# Patient Record
Sex: Female | Born: 1990 | Hispanic: Yes | Marital: Single | Smoking: Light tobacco smoker
Health system: Southern US, Community
[De-identification: ages and names within clinical notes are randomized; demographics above are authoritative.]

## PROBLEM LIST (undated history)

## (undated) DIAGNOSIS — N946 Dysmenorrhea, unspecified: Secondary | ICD-10-CM

## (undated) DIAGNOSIS — F329 Major depressive disorder, single episode, unspecified: Secondary | ICD-10-CM

## (undated) DIAGNOSIS — F319 Bipolar disorder, unspecified: Secondary | ICD-10-CM

## (undated) DIAGNOSIS — F419 Anxiety disorder, unspecified: Secondary | ICD-10-CM

## (undated) DIAGNOSIS — F32A Depression, unspecified: Secondary | ICD-10-CM

## (undated) HISTORY — DX: Major depressive disorder, single episode, unspecified: F32.9

## (undated) HISTORY — DX: Depression, unspecified: F32.A

## (undated) HISTORY — DX: Anxiety disorder, unspecified: F41.9

## (undated) HISTORY — DX: Dysmenorrhea, unspecified: N94.6

---
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
## 2014-12-26 ENCOUNTER — Emergency Department (HOSPITAL_COMMUNITY): Payer: BLUE CROSS/BLUE SHIELD

## 2014-12-26 ENCOUNTER — Inpatient Hospital Stay (HOSPITAL_COMMUNITY)
Admission: EM | Admit: 2014-12-26 | Discharge: 2014-12-30 | DRG: 086 | Disposition: A | Discharge status: Home / Self Care | Payer: BLUE CROSS/BLUE SHIELD | Attending: General Surgery | Admitting: General Surgery

## 2014-12-26 ENCOUNTER — Encounter (HOSPITAL_COMMUNITY): Payer: Self-pay | Admitting: Radiology

## 2014-12-26 DIAGNOSIS — S0101XA Laceration without foreign body of scalp, initial encounter: Secondary | ICD-10-CM | POA: Diagnosis present

## 2014-12-26 DIAGNOSIS — K5900 Constipation, unspecified: Secondary | ICD-10-CM | POA: Diagnosis not present

## 2014-12-26 DIAGNOSIS — H9191 Unspecified hearing loss, right ear: Secondary | ICD-10-CM | POA: Diagnosis not present

## 2014-12-26 DIAGNOSIS — S060XAA Concussion with loss of consciousness status unknown, initial encounter: Secondary | ICD-10-CM | POA: Diagnosis present

## 2014-12-26 DIAGNOSIS — S2241XA Multiple fractures of ribs, right side, initial encounter for closed fracture: Secondary | ICD-10-CM

## 2014-12-26 DIAGNOSIS — R51 Headache: Secondary | ICD-10-CM | POA: Diagnosis present

## 2014-12-26 DIAGNOSIS — S01511A Laceration without foreign body of lip, initial encounter: Secondary | ICD-10-CM | POA: Diagnosis present

## 2014-12-26 DIAGNOSIS — S82899A Other fracture of unspecified lower leg, initial encounter for closed fracture: Secondary | ICD-10-CM

## 2014-12-26 DIAGNOSIS — S060X9A Concussion with loss of consciousness of unspecified duration, initial encounter: Secondary | ICD-10-CM | POA: Diagnosis present

## 2014-12-26 DIAGNOSIS — S0300XA Dislocation of jaw, unspecified side, initial encounter: Secondary | ICD-10-CM | POA: Diagnosis present

## 2014-12-26 DIAGNOSIS — H9190 Unspecified hearing loss, unspecified ear: Secondary | ICD-10-CM

## 2014-12-26 DIAGNOSIS — S0219XA Other fracture of base of skull, initial encounter for closed fracture: Secondary | ICD-10-CM | POA: Diagnosis present

## 2014-12-26 DIAGNOSIS — S2249XA Multiple fractures of ribs, unspecified side, initial encounter for closed fracture: Secondary | ICD-10-CM | POA: Diagnosis present

## 2014-12-26 DIAGNOSIS — T1490XA Injury, unspecified, initial encounter: Secondary | ICD-10-CM

## 2014-12-26 DIAGNOSIS — T07XXXA Unspecified multiple injuries, initial encounter: Secondary | ICD-10-CM

## 2014-12-26 DIAGNOSIS — W19XXXA Unspecified fall, initial encounter: Secondary | ICD-10-CM | POA: Diagnosis present

## 2014-12-26 DIAGNOSIS — F319 Bipolar disorder, unspecified: Secondary | ICD-10-CM | POA: Diagnosis present

## 2014-12-26 DIAGNOSIS — H7421 Discontinuity and dislocation of right ear ossicles: Secondary | ICD-10-CM | POA: Diagnosis not present

## 2014-12-26 DIAGNOSIS — S8252XA Displaced fracture of medial malleolus of left tibia, initial encounter for closed fracture: Secondary | ICD-10-CM | POA: Diagnosis present

## 2014-12-26 DIAGNOSIS — S82892A Other fracture of left lower leg, initial encounter for closed fracture: Secondary | ICD-10-CM | POA: Diagnosis present

## 2014-12-26 HISTORY — DX: Bipolar disorder, unspecified: F31.9

## 2014-12-26 LAB — I-STAT CHEM 8, ED
BUN: 12 mg/dL (ref 6–20)
CHLORIDE: 108 mmol/L (ref 101–111)
CREATININE: 0.9 mg/dL (ref 0.44–1.00)
Calcium, Ion: 1.1 mmol/L — ABNORMAL LOW (ref 1.12–1.23)
Glucose, Bld: 126 mg/dL — ABNORMAL HIGH (ref 65–99)
HEMATOCRIT: 36 % (ref 36.0–46.0)
Hemoglobin: 12.2 g/dL (ref 12.0–15.0)
POTASSIUM: 3.1 mmol/L — AB (ref 3.5–5.1)
SODIUM: 141 mmol/L (ref 135–145)
TCO2: 17 mmol/L (ref 0–100)

## 2014-12-26 LAB — ABO/RH: ABO/RH(D): O POS

## 2014-12-26 LAB — COMPREHENSIVE METABOLIC PANEL
ALBUMIN: 3.4 g/dL — AB (ref 3.5–5.0)
ALK PHOS: 55 U/L (ref 38–126)
ALT: 27 U/L (ref 14–54)
ANION GAP: 7 (ref 5–15)
AST: 37 U/L (ref 15–41)
BUN: 11 mg/dL (ref 6–20)
CALCIUM: 8.4 mg/dL — AB (ref 8.9–10.3)
CHLORIDE: 113 mmol/L — AB (ref 101–111)
CO2: 19 mmol/L — AB (ref 22–32)
Creatinine, Ser: 1 mg/dL (ref 0.44–1.00)
GFR calc Af Amer: >60 mL/min (ref >60)
GFR calc non Af Amer: >60 mL/min (ref >60)
GLUCOSE: 123 mg/dL — AB (ref 65–99)
Potassium: 3.2 mmol/L — ABNORMAL LOW (ref 3.5–5.1)
SODIUM: 139 mmol/L (ref 135–145)
Total Bilirubin: 0.4 mg/dL (ref 0.3–1.2)
Total Protein: 5.4 g/dL — ABNORMAL LOW (ref 6.5–8.1)

## 2014-12-26 LAB — PREPARE FRESH FROZEN PLASMA
UNIT DIVISION: 0
UNIT DIVISION: 0

## 2014-12-26 LAB — I-STAT BETA HCG BLOOD, ED (MC, WL, AP ONLY): I-stat hCG, quantitative: <5 m[IU]/mL (ref <5)

## 2014-12-26 LAB — CBC
HEMATOCRIT: 33.4 % — AB (ref 36.0–46.0)
HEMOGLOBIN: 11.6 g/dL — AB (ref 12.0–15.0)
MCH: 30.7 pg (ref 26.0–34.0)
MCHC: 34.7 g/dL (ref 30.0–36.0)
MCV: 88.4 fL (ref 78.0–100.0)
Platelets: 250 10*3/uL (ref 150–400)
RBC: 3.78 MIL/uL — AB (ref 3.87–5.11)
RDW: 12.1 % (ref 11.5–15.5)
WBC: 12.9 10*3/uL — ABNORMAL HIGH (ref 4.0–10.5)

## 2014-12-26 LAB — PROTIME-INR
INR: 1.15 (ref 0.00–1.49)
Prothrombin Time: 14.9 seconds (ref 11.6–15.2)

## 2014-12-26 LAB — ETHANOL

## 2014-12-26 MED ORDER — HYDROMORPHONE HCL 1 MG/ML IJ SOLN
0.5000 mg | INTRAMUSCULAR | Status: DC | PRN
  Administered 2014-12-26: 1 mg via INTRAVENOUS
  Administered 2014-12-27: 0.5 mg via INTRAVENOUS
  Filled 2014-12-26 (×2): qty 1

## 2014-12-26 MED ORDER — IOHEXOL 350 MG/ML SOLN: 50.0000 mL | Freq: Once | INTRAVENOUS | Status: DC | PRN

## 2014-12-26 MED ORDER — PANTOPRAZOLE SODIUM 40 MG IV SOLR
40.0000 mg | Freq: Every day | INTRAVENOUS | Status: DC
  Filled 2014-12-26: qty 40

## 2014-12-26 MED ORDER — ONDANSETRON HCL 4 MG PO TABS
4.0000 mg | ORAL_TABLET | Freq: Four times a day (QID) | ORAL | Status: DC | PRN
  Administered 2014-12-28 – 2014-12-30 (×2): 4 mg via ORAL
  Filled 2014-12-26 (×2): qty 1

## 2014-12-26 MED ORDER — IOHEXOL 350 MG/ML SOLN
50.0000 mL | Freq: Once | INTRAVENOUS | Status: AC | PRN
Start: 2014-12-26 — End: 2014-12-26
  Administered 2014-12-26: 50 mL via INTRAVENOUS

## 2014-12-26 MED ORDER — PANTOPRAZOLE SODIUM 40 MG PO TBEC
40.0000 mg | DELAYED_RELEASE_TABLET | Freq: Every day | ORAL | Status: DC
  Administered 2014-12-27: 40 mg via ORAL
  Filled 2014-12-26: qty 1

## 2014-12-26 MED ORDER — OXYCODONE HCL 5 MG PO TABS
5.0000 mg | ORAL_TABLET | ORAL | Status: DC | PRN
  Administered 2014-12-27: 15 mg via ORAL
  Administered 2014-12-27 (×2): 5 mg via ORAL
  Administered 2014-12-27 – 2014-12-28 (×4): 15 mg via ORAL
  Administered 2014-12-28 – 2014-12-29 (×3): 10 mg via ORAL
  Administered 2014-12-29 – 2014-12-30 (×6): 15 mg via ORAL
  Filled 2014-12-26: qty 1
  Filled 2014-12-26: qty 3
  Filled 2014-12-26: qty 2
  Filled 2014-12-26 (×2): qty 3
  Filled 2014-12-26: qty 2
  Filled 2014-12-26 (×3): qty 3
  Filled 2014-12-26 (×2): qty 1
  Filled 2014-12-26 (×3): qty 3
  Filled 2014-12-26: qty 2
  Filled 2014-12-26: qty 3
  Filled 2014-12-26: qty 2

## 2014-12-26 MED ORDER — ACETAMINOPHEN 325 MG PO TABS: 650.0000 mg | ORAL_TABLET | ORAL | Status: DC | PRN

## 2014-12-26 MED ORDER — KCL IN DEXTROSE-NACL 20-5-0.45 MEQ/L-%-% IV SOLN
INTRAVENOUS | Status: DC
  Administered 2014-12-27: 01:00:00 via INTRAVENOUS
  Filled 2014-12-26 (×2): qty 1000

## 2014-12-26 MED ORDER — LIDOCAINE HCL (PF) 1 % IJ SOLN
5.0000 mL | Freq: Once | INTRAMUSCULAR | Status: AC
  Administered 2014-12-26: 5 mL via INTRADERMAL
  Filled 2014-12-26: qty 5

## 2014-12-26 MED ORDER — FENTANYL CITRATE (PF) 100 MCG/2ML IJ SOLN
INTRAMUSCULAR | Status: AC
  Filled 2014-12-26: qty 2

## 2014-12-26 MED ORDER — HYDROMORPHONE HCL 1 MG/ML IJ SOLN
1.0000 mg | Freq: Once | INTRAMUSCULAR | Status: AC
  Administered 2014-12-26: 1 mg via INTRAVENOUS
  Filled 2014-12-26: qty 1

## 2014-12-26 MED ORDER — ONDANSETRON HCL 4 MG/2ML IJ SOLN
4.0000 mg | Freq: Four times a day (QID) | INTRAMUSCULAR | Status: DC | PRN
  Administered 2014-12-27 – 2014-12-30 (×8): 4 mg via INTRAVENOUS
  Filled 2014-12-26 (×9): qty 2

## 2014-12-26 MED ORDER — IPRATROPIUM-ALBUTEROL 0.5-2.5 (3) MG/3ML IN SOLN: 3.0000 mL | RESPIRATORY_TRACT | Status: DC | PRN

## 2014-12-26 MED ORDER — FENTANYL CITRATE (PF) 100 MCG/2ML IJ SOLN
25.0000 ug | Freq: Once | INTRAMUSCULAR | Status: AC
  Administered 2014-12-26: 25 ug via INTRAVENOUS

## 2014-12-26 NOTE — Progress Notes (Signed)
Assisted pt in calling her mother.  Mother en route to hospital from [place].
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
## 2014-12-26 NOTE — ED Notes (Addendum)
Suture cart at bedside and lidocaine given to Resident

## 2014-12-26 NOTE — ED Notes (Signed)
Patient undressed, in gown, on monitor, continuous pulse oximetry and blood pressure cuff 
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
## 2014-12-26 NOTE — ED Notes (Signed)
Dr Janee Mornhompson in room with pt.

## 2014-12-27 ENCOUNTER — Encounter (HOSPITAL_COMMUNITY): Payer: Self-pay | Admitting: *Deleted

## 2014-12-27 ENCOUNTER — Encounter: Payer: Self-pay | Admitting: Orthopedic Surgery

## 2014-12-27 ENCOUNTER — Inpatient Hospital Stay (HOSPITAL_COMMUNITY): Payer: BLUE CROSS/BLUE SHIELD

## 2014-12-27 DIAGNOSIS — W19XXXA Unspecified fall, initial encounter: Secondary | ICD-10-CM | POA: Diagnosis present

## 2014-12-27 DIAGNOSIS — S060XAA Concussion with loss of consciousness status unknown, initial encounter: Secondary | ICD-10-CM | POA: Diagnosis present

## 2014-12-27 DIAGNOSIS — S060X9A Concussion with loss of consciousness of unspecified duration, initial encounter: Secondary | ICD-10-CM | POA: Diagnosis present

## 2014-12-27 DIAGNOSIS — S82892A Other fracture of left lower leg, initial encounter for closed fracture: Secondary | ICD-10-CM | POA: Diagnosis present

## 2014-12-27 DIAGNOSIS — S0300XA Dislocation of jaw, unspecified side, initial encounter: Secondary | ICD-10-CM | POA: Diagnosis present

## 2014-12-27 DIAGNOSIS — S0101XA Laceration without foreign body of scalp, initial encounter: Secondary | ICD-10-CM | POA: Diagnosis present

## 2014-12-27 DIAGNOSIS — T07XXXA Unspecified multiple injuries, initial encounter: Secondary | ICD-10-CM

## 2014-12-27 LAB — TYPE AND SCREEN
ABO/RH(D): O POS
Antibody Screen: NEGATIVE
UNIT DIVISION: 0
Unit division: 0

## 2014-12-27 LAB — BLOOD PRODUCT ORDER (VERBAL) VERIFICATION

## 2014-12-27 LAB — BASIC METABOLIC PANEL
ANION GAP: 7 (ref 5–15)
BUN: 10 mg/dL (ref 6–20)
CHLORIDE: 108 mmol/L (ref 101–111)
CO2: 22 mmol/L (ref 22–32)
Calcium: 8.6 mg/dL — ABNORMAL LOW (ref 8.9–10.3)
Creatinine, Ser: 0.81 mg/dL (ref 0.44–1.00)
GFR calc non Af Amer: >60 mL/min (ref >60)
GLUCOSE: 139 mg/dL — AB (ref 65–99)
Potassium: 4 mmol/L (ref 3.5–5.1)
Sodium: 137 mmol/L (ref 135–145)

## 2014-12-27 LAB — CBC
HEMATOCRIT: 32.9 % — AB (ref 36.0–46.0)
Hemoglobin: 11.1 g/dL — ABNORMAL LOW (ref 12.0–15.0)
MCH: 30.1 pg (ref 26.0–34.0)
MCHC: 33.7 g/dL (ref 30.0–36.0)
MCV: 89.2 fL (ref 78.0–100.0)
Platelets: 225 10*3/uL (ref 150–400)
RBC: 3.69 MIL/uL — ABNORMAL LOW (ref 3.87–5.11)
RDW: 12.3 % (ref 11.5–15.5)
WBC: 13.3 10*3/uL — AB (ref 4.0–10.5)

## 2014-12-27 LAB — CDS SEROLOGY

## 2014-12-27 LAB — MRSA PCR SCREENING: MRSA BY PCR: NEGATIVE

## 2014-12-27 MED ORDER — BACITRACIN ZINC 500 UNIT/GM EX OINT
TOPICAL_OINTMENT | Freq: Two times a day (BID) | CUTANEOUS | Status: DC
  Administered 2014-12-27 – 2014-12-30 (×7): via TOPICAL
  Filled 2014-12-27 (×2): qty 28.35
  Filled 2014-12-27: qty 56.7

## 2014-12-27 MED ORDER — DOCUSATE SODIUM 100 MG PO CAPS
100.0000 mg | ORAL_CAPSULE | Freq: Two times a day (BID) | ORAL | Status: DC
  Administered 2014-12-27 – 2014-12-30 (×7): 100 mg via ORAL
  Filled 2014-12-27 (×7): qty 1

## 2014-12-27 MED ORDER — BUSPIRONE HCL 5 MG PO TABS
5.0000 mg | ORAL_TABLET | ORAL | Status: DC | PRN
Start: 2014-12-27 — End: 2014-12-30
  Filled 2014-12-27: qty 1

## 2014-12-27 MED ORDER — ENOXAPARIN SODIUM 40 MG/0.4ML ~~LOC~~ SOLN
40.0000 mg | Freq: Every day | SUBCUTANEOUS | Status: DC
  Administered 2014-12-27 – 2014-12-30 (×4): 40 mg via SUBCUTANEOUS
  Filled 2014-12-27 (×4): qty 0.4

## 2014-12-27 MED ORDER — LAMOTRIGINE 150 MG PO TABS
250.0000 mg | ORAL_TABLET | Freq: Every day | ORAL | Status: DC
  Administered 2014-12-27 – 2014-12-30 (×4): 250 mg via ORAL
  Filled 2014-12-27 (×4): qty 1

## 2014-12-27 MED ORDER — HYDROXYZINE HCL 25 MG PO TABS
25.0000 mg | ORAL_TABLET | Freq: Three times a day (TID) | ORAL | Status: DC | PRN
  Filled 2014-12-27: qty 1

## 2014-12-27 MED ORDER — HYDROMORPHONE HCL 1 MG/ML IJ SOLN
0.5000 mg | INTRAMUSCULAR | Status: DC | PRN
  Administered 2014-12-27 – 2014-12-28 (×4): 0.5 mg via INTRAVENOUS
  Filled 2014-12-27 (×4): qty 1

## 2014-12-27 MED ORDER — POLYETHYLENE GLYCOL 3350 17 G PO PACK
17.0000 g | PACK | Freq: Every day | ORAL | Status: DC
  Administered 2014-12-28: 17 g via ORAL
  Filled 2014-12-27 (×2): qty 1

## 2014-12-27 NOTE — Progress Notes (Signed)
Recieved patient from 3S. Alert and oriented, not in any distress. Oriented to unit and staff. Will continue to monitor
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
## 2014-12-27 NOTE — Evaluation (Signed)
Physical Therapy Evaluation Patient Details Name: Janice Rowland MRN: [id number] DOB: 1990/05/05 Today's Date: 12/27/2014   History of Present Illness  pt presents with L Medial Malleolus fx, R Ribs 2-9 fxs, R TMJ Dislocation with spontaneous relocation, and R Temporal fx with inner ear involvement and hearing loss.    Clinical Impression  Pt very painful in R ribs during all mobility, but follows directions well and anticipate she will make great progress.  Feel pt should progress to be able to use crutches by D/C, but if not will need RW for safety at D/C.  Will continue to follow.      Follow Up Recommendations Supervision/Assistance - 24 hour    Equipment Recommendations  Crutches (pending progress may need RW)    Recommendations for Other Services       Precautions / Restrictions Precautions Precautions: Fall Restrictions Weight Bearing Restrictions: Yes LLE Weight Bearing: Touchdown weight bearing      Mobility  Bed Mobility Overal bed mobility: Needs Assistance Bed Mobility: Supine to Sit     Supine to sit: Supervision;HOB elevated     General bed mobility comments: pt able to slowly bring herself towards EOB with cues for sequencing.    Transfers Overall transfer level: Needs assistance Equipment used: Rolling walker (2 wheeled) Transfers: Sit to/from Stand Sit to Stand: Min guard         General transfer comment: pt comes to stand without physical A, only guarding for balance.  cues for safe technique.  pt indicates dizziness upon initially coming to sitting, but resolves quickly.    Ambulation/Gait Ambulation/Gait assistance: Min guard Ambulation Distance (Feet): 10 Feet Assistive device: Rolling walker (2 wheeled) Gait Pattern/deviations: Step-to pattern     General Gait Details: cues for sequencing with RW use and maintaining TDWBing.    Stairs            Wheelchair Mobility    Modified Rankin (Stroke Patients Only)       Balance  Overall balance assessment: Needs assistance Sitting-balance support: No upper extremity supported;Feet unsupported Sitting balance-Leahy Scale: Good     Standing balance support: Bilateral upper extremity supported;Single extremity supported;During functional activity Standing balance-Leahy Scale: Fair                               Pertinent Vitals/Pain Pain Assessment: 0-10 Pain Score: 6  Pain Location: R ribs > L ankle during mobility.   Pain Descriptors / Indicators: Sharp Pain Intervention(s): Monitored during session;Premedicated before session;Repositioned    Home Living Family/patient expects to be discharged to:: Private residence Living Arrangements: Parent Available Help at Discharge: Family;Available 24 hours/day Type of Home: House Home Access: Stairs to enter   [employer] of Steps: 3-4 Home Layout: Two level;1/2 bath on main level Home Equipment: Walker - standard (Mother thinks it's a standard walker at home.  )      Prior Function Level of Independence: Independent         Comments: pt is Consulting civil engineer at [employer].       Hand Dominance        Extremity/Trunk Assessment   Upper Extremity Assessment: Defer to OT evaluation           Lower Extremity Assessment: LLE deficits/detail   LLE Deficits / Details: Able to functionally move LE, but not formally assessed 2/2 pain in L ankle.    Cervical / Trunk Assessment: Normal  Communication   Communication: No  difficulties  Cognition Arousal/Alertness: Awake/alert Behavior During Therapy: WFL for tasks assessed/performed Overall Cognitive Status: Within Functional Limits for tasks assessed                      General Comments      Exercises        Assessment/Plan    PT Assessment Patient needs continued PT services  PT Diagnosis Difficulty walking;Acute pain   PT Problem List Decreased activity tolerance;Decreased balance;Decreased mobility;Decreased knowledge  of use of DME;Decreased knowledge of precautions;Pain  PT Treatment Interventions DME instruction;Gait training;Stair training;Functional mobility training;Therapeutic activities;Therapeutic exercise;Balance training;Patient/family education   PT Goals (Current goals can be found in the Care Plan section) Acute Rehab PT Goals Patient Stated Goal: Back to normal PT Goal Formulation: With patient Time For Goal Achievement: 01/03/15 Potential to Achieve Goals: Good    Frequency Min 5X/week   Barriers to discharge        Co-evaluation               End of Session   Activity Tolerance: Patient limited by pain Patient left: in chair;with call bell/phone within reach;with family/visitor present Nurse Communication: Mobility status         Time: 1327-1400 PT Time Calculation (min) (ACUTE ONLY): 33 min   Charges:   PT Evaluation $Initial PT Evaluation Tier I: 1 Procedure PT Treatments $Gait Training: 8-22 mins   PT G CodesSunny Schlein:        Bertin Inabinet F, [place]PT [phone] 12/27/2014, 2:25 PM

## 2014-12-27 NOTE — Progress Notes (Signed)
Report given to Christel MormonZee, RN on [facility].

## 2014-12-27 NOTE — Care Management (Signed)
UR completed 

## 2014-12-27 NOTE — Progress Notes (Signed)
Patient ID: Janice HaroldLaura Rowland, female   DOB: [birth year], 24 y.o.   MRN: [id number]   LOS: 1 day   Subjective: C/o pain as expected. Also right hearing loss.   Objective: Vital signs in last 24 hours: Temp:  [98.4 F (36.9 C)-99 F (37.2 C)] 99 F (37.2 C) (10/21 0812) Pulse Rate:  [78-105] 96 (10/21 0610) Resp:  [13-23] 19 (10/21 0610) BP: (91-117)/(50-73) 104/69 mmHg (10/21 0610) SpO2:  [98 %-100 %] 100 % (10/21 0610) Weight:  [50.803 kg (112 lb)-51.8 kg (114 lb 3.2 oz)] 51.8 kg (114 lb 3.2 oz) (10/20 2346)    IS: 500ml   Laboratory  CBC  Recent Labs  12/26/14 1841 12/26/14 1851 12/27/14 0356  WBC 12.9*  --  13.3*  HGB 11.6* 12.2 11.1*  HCT 33.4* 36.0 32.9*  PLT 250  --  225   BMET  Recent Labs  12/26/14 1841 12/26/14 1851 12/27/14 0356  NA 139 141 137  K 3.2* 3.1* 4.0  CL 113* 108 108  CO2 19*  --  22  GLUCOSE 123* 126* 139*  BUN 11 12 10   CREATININE 1.00 0.90 0.81  CALCIUM 8.4*  --  8.6*    Radiology Results PORTABLE CHEST 1 VIEW  COMPARISON: Chest x-ray and CT of the chest on 12/26/2014  FINDINGS: Cardiac and mediastinal contours remain normal. No pneumothorax or pleural effusion identified. Right-sided anterior rib fractures showed no significant change by chest x-ray. No significant atelectasis.  IMPRESSION: No pneumothorax, evidence of hemothorax or significant atelectasis.   Electronically Signed  By: Irish LackGlenn Yamagata M.D.  On: 12/27/2014 07:58   Physical Exam General appearance: alert and no distress Resp: clear to auscultation bilaterally Cardio: regular rate and rhythm GI: normal findings: bowel sounds normal and soft, non-tender Extremities: NVI   Assessment/Plan: Fall Concussion w/scalp lac -- Local care, ST eval TMJ dislocation -- Soft diet, will need ENT for this and possible hearing loss as OP. Will get temporal bone CT to r/o ossicular disruption Multiple right rib fxs -- Pulmonary toilet Left ankle fx -- TDWB,  repeat x-rays on Sunday with possible ORIF Monday or Tuesday if significant displacement Bipolar D/O -- Home meds FEN -- Orals for pain VTE -- SCD's, start Lovenox Dispo -- Transfer to floor, TBI team    Freeman CaldronMichael J. Syretta Kochel, PA-C Pager: [phone] General Trauma [place] Pager: [phone]  12/27/2014
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
## 2014-12-27 NOTE — Progress Notes (Signed)
PT Cancellation Note  Patient Details Name: Janice Rowland MRN: [id number] DOB: Jul 02, 1990   Cancelled Treatment:    Reason Eval/Treat Not Completed: Patient at procedure or test/unavailable.  Just passed pt being transported down to CT.  Will f/u another time.     Janice Rowland, Alison MurrayMegan F 12/27/2014, 11:17 AM

## 2014-12-27 NOTE — Progress Notes (Signed)
OT Cancellation Note  Patient Details Name: Genia HaroldLaura Sandra MRN: [id number] DOB: [birth year]   Cancelled Treatment:    Reason Eval/Treat Not Completed: Patient at procedure or test/ unavailable. Will follow up when appropriate.  [facility] HospitalWARD,HILLARY  Wai Litt, OTR/L  [phone] 12/27/2014 12/27/2014, 1:25 PM
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
## 2014-12-28 NOTE — Progress Notes (Signed)
Patient ID: Genia HaroldLaura Schneeberger, female   DOB: Jan 18, 1991, 24 y.o.   MRN: [id number]   LOS: 2 days   Subjective: C/o pain as expected. Seems to be adequately controlled.  Ambulating well.  Having some nausea this morning.   Objective: Vital signs in last 24 hours: Temp:  [98.4 F (36.9 C)-99.3 F (37.4 C)] 98.4 F (36.9 C) (10/22 0540) Pulse Rate:  [78-98] 90 (10/22 0540) Resp:  [16-24] 16 (10/22 0540) BP: (91-99)/(53-63) 97/54 mmHg (10/22 0540) SpO2:  [97 %-100 %] 97 % (10/22 0540)      Laboratory  CBC  Recent Labs  12/26/14 1841 12/26/14 1851 12/27/14 0356  WBC 12.9*  --  13.3*  HGB 11.6* 12.2 11.1*  HCT 33.4* 36.0 32.9*  PLT 250  --  225   BMET  Recent Labs  12/26/14 1841 12/26/14 1851 12/27/14 0356  NA 139 141 137  K 3.2* 3.1* 4.0  CL 113* 108 108  CO2 19*  --  22  GLUCOSE 123* 126* 139*  BUN 11 12 10   CREATININE 1.00 0.90 0.81  CALCIUM 8.4*  --  8.6*    Radiology Results PORTABLE CHEST 1 VIEW  COMPARISON: Chest x-ray and CT of the chest on 12/26/2014  FINDINGS: Cardiac and mediastinal contours remain normal. No pneumothorax or pleural effusion identified. Right-sided anterior rib fractures showed no significant change by chest x-ray. No significant atelectasis.  IMPRESSION: No pneumothorax, evidence of hemothorax or significant atelectasis.   Electronically Signed  By: Irish LackGlenn Yamagata M.D.  On: 12/27/2014 07:58   Physical Exam General appearance: alert and no distress Resp: clear to auscultation bilaterally Cardio: regular rate and rhythm GI: normal findings: bowel sounds normal and soft, non-tender Extremities: NVI   Assessment/Plan: Fall Concussion w/scalp lac -- Local care TMJ dislocation -- Soft diet, R temporal bone fx with hematoma, will need hearing test in 1-2 weeks as an outpatient to evaluate further Multiple right rib fxs -- Pulmonary toilet Left ankle fx -- TDWB, repeat x-rays on Sunday with possible ORIF Monday or  Tuesday if significant displacement Bipolar D/O -- Home meds FEN -- Orals for pain VTE -- SCD's, start Lovenox Dispo -- Await ortho recs and possible OR next week   Vanita PandaAlicia C Avonell Lenig, MD  Colorectal and General Surgery [facility]   12/28/2014

## 2014-12-28 NOTE — Progress Notes (Signed)
Physical Therapy Treatment Patient Details Name: Janice Rowland MRN: [id number] DOB: [birth year] Today's Date: 12/28/2014    History of Present Illness      PT Comments    Patient came very emotional during session today. She verbalize the incident with myself and PT tech. Encouragement provided. She is planning on going to [place] to stay with her mother and stepfather for hopefully no more than a week. She is hopeful after that she can go back to school and use a knee walker to get to class. Patient is concerned bc she currently lives with boyfriend and since broken up. Continue with current POC  Follow Up Recommendations  Supervision/Assistance - 24 hour     Equipment Recommendations       Recommendations for Other Services       Precautions / Restrictions Precautions Precautions: Fall Restrictions Weight Bearing Restrictions: Yes LLE Weight Bearing: Touchdown weight bearing    Mobility  Bed Mobility         Supine to sit: Supervision;HOB elevated     General bed mobility comments: pt able to slowly bring herself towards EOB with cues for sequencing.  Educated on rolling as well to ease pain with transition  Transfers Overall transfer level: Needs assistance Equipment used: Rolling walker (2 wheeled)   Sit to Stand: Supervision         General transfer comment: Cues for hand placement and to ensure TWB  Ambulation/Gait Ambulation/Gait assistance: Min guard Ambulation Distance (Feet): 15 Feet Assistive device: Rolling walker (2 wheeled) Gait Pattern/deviations: Step-to pattern     General Gait Details: cues for sequencing with RW use and maintaining TDWBing.     Stairs            Wheelchair Mobility    Modified Rankin (Stroke Patients Only)       Balance                                    Cognition Arousal/Alertness: Awake/alert Behavior During Therapy: WFL for tasks assessed/performed Overall Cognitive Status: Within  Functional Limits for tasks assessed                      Exercises      General Comments        Pertinent Vitals/Pain Pain Score: 7  Pain Location: R ribs and L ankle with mobility Pain Descriptors / Indicators: Aching;Sore;Sharp Pain Intervention(s): Monitored during session    Home Living                      Prior Function            PT Goals (current goals can now be found in the care plan section) Progress towards PT goals: Progressing toward goals    Frequency  Min 5X/week    PT Plan Current plan remains appropriate    Co-evaluation             End of Session   Activity Tolerance: Patient limited by pain Patient left: in chair;with call bell/phone within reach     Time: 0926-0951 PT Time Calculation (min) (ACUTE ONLY): 25 min  Charges:  $Gait Training: 8-22 mins $Therapeutic Activity: 8-22 mins                    G Codes:      Fredrich BirksRobinette, Cresencio Reesor Elizabeth 12/28/2014, 10:03 AM  12/28/2014  Tymar Polyak, Tonia Brooms PTA

## 2014-12-28 NOTE — Progress Notes (Signed)
Foley d/c d this morning as ordered. Pt has voided this afternoon since foley removal
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
## 2014-12-28 NOTE — Progress Notes (Signed)
Prn zofran administered for c/o nausea 

## 2014-12-28 NOTE — Progress Notes (Signed)
Prn pain medicine administered times 4 today. Pt up and about in room walking with walker

## 2014-12-29 ENCOUNTER — Inpatient Hospital Stay (HOSPITAL_COMMUNITY): Payer: BLUE CROSS/BLUE SHIELD

## 2014-12-29 MED ORDER — POLYETHYLENE GLYCOL 3350 17 G PO PACK
17.0000 g | PACK | Freq: Two times a day (BID) | ORAL | Status: DC
  Administered 2014-12-29 – 2014-12-30 (×3): 17 g via ORAL
  Filled 2014-12-29 (×2): qty 1

## 2014-12-29 NOTE — Progress Notes (Signed)
Patient vomited about 90 mL consisting of last night's mixed fruit drink (spinach, kale, apple, banana) and stated, "I felt better after vomiting". Patient sitting on recliner doing class homework.

## 2014-12-29 NOTE — Progress Notes (Signed)
Patient ID: Janice HaroldLaura Rowland, female   DOB: 04-Oct-1990, 24 y.o.   MRN: [id number]   LOS: 3 days   Subjective: C/o pain as expected. Seems to be adequately controlled.  Having vertigo and difficulty with nausea, especially when she first gets out of bed.  Feels somewhat constipated as well.  Was unable to have a BM yesterday.   IS: 500ml  Objective: Vital signs in last 24 hours: Temp:  [98.7 F (37.1 C)-99.2 F (37.3 C)] 99.2 F (37.3 C) (10/23 0533) Pulse Rate:  [83-87] 85 (10/23 0533) Resp:  [16-19] 19 (10/23 0533) BP: (100-108)/(56-64) 100/56 mmHg (10/23 0533) SpO2:  [100 %] 100 % (10/23 0533) Last BM Date: 12/25/14    Laboratory  CBC  Recent Labs  12/26/14 1841 12/26/14 1851 12/27/14 0356  WBC 12.9*  --  13.3*  HGB 11.6* 12.2 11.1*  HCT 33.4* 36.0 32.9*  PLT 250  --  225   BMET  Recent Labs  12/26/14 1841 12/26/14 1851 12/27/14 0356  NA 139 141 137  K 3.2* 3.1* 4.0  CL 113* 108 108  CO2 19*  --  22  GLUCOSE 123* 126* 139*  BUN 11 12 10   CREATININE 1.00 0.90 0.81  CALCIUM 8.4*  --  8.6*    Radiology Results PORTABLE CHEST 1 VIEW  COMPARISON: Chest x-ray and CT of the chest on 12/26/2014  FINDINGS: Cardiac and mediastinal contours remain normal. No pneumothorax or pleural effusion identified. Right-sided anterior rib fractures showed no significant change by chest x-ray. No significant atelectasis.  IMPRESSION: No pneumothorax, evidence of hemothorax or significant atelectasis.   Electronically Signed  By: Janice Rowland M.D.  On: 12/27/2014 07:58   Physical Exam General appearance: alert and no distress Resp: clear to auscultation bilaterally Cardio: regular rate and rhythm GI: normal findings: bowel sounds normal and soft, non-tender Extremities: NVI   Assessment/Plan: Fall Concussion w/scalp lac -- Local care TMJ dislocation -- Soft diet, R temporal bone fx with hematoma, will need hearing test in 1-2 weeks as an outpatient  to evaluate further, Pt having some vertigo with movement as well Multiple right rib fxs -- Pulmonary toilet Left ankle fx -- TDWB, repeat x-rays today with possible ORIF Monday or Tuesday if significant displacement Bipolar D/O -- Home meds FEN -- Orals for pain VTE -- SCD's, start Lovenox Dispo -- Await ortho recs and possible OR next week   Janice PandaAlicia C Elizebeth Kluesner, MD  Colorectal and General Surgery [facility]   12/29/2014
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
## 2014-12-29 NOTE — Progress Notes (Signed)
Pt pushing 750 on incentive spirometry. 10mg  oxycodone administered for pain

## 2014-12-30 DIAGNOSIS — S0219XA Other fracture of base of skull, initial encounter for closed fracture: Secondary | ICD-10-CM | POA: Diagnosis present

## 2014-12-30 DIAGNOSIS — H7421 Discontinuity and dislocation of right ear ossicles: Secondary | ICD-10-CM | POA: Diagnosis present

## 2014-12-30 MED ORDER — ONDANSETRON HCL 4 MG PO TABS: 4.0000 mg | ORAL_TABLET | Freq: Four times a day (QID) | ORAL | Status: DC | PRN

## 2014-12-30 MED ORDER — OXYCODONE-ACETAMINOPHEN 10-325 MG PO TABS: 1.0000 | ORAL_TABLET | ORAL | Status: DC | PRN

## 2014-12-30 NOTE — Discharge Instructions (Signed)
Wash wounds daily in shower with soap and water. Do not soak. Apply antibiotic ointment (e.g. Neosporin) twice daily and as needed to keep moist. Cover with dry dressing.  No driving while taking oxycodone.  Keep boot on and use walker when walking.

## 2014-12-30 NOTE — Progress Notes (Signed)
Patient ID: Janice HaroldLaura Rowland, female   DOB: 04/22/1990, 24 y.o.   MRN: [id number]   LOS: 4 days   Subjective: Doing pretty well. Dizzy, especially when she first gets up, but was up to BR on own this am.   Objective: Vital signs in last 24 hours: Temp:  [98.9 F (37.2 C)-99.3 F (37.4 C)] 99.2 F (37.3 C) (10/24 0537) Pulse Rate:  [90-100] 100 (10/24 0537) Resp:  [18-19] 19 (10/24 0537) BP: (102-114)/(56-66) 105/66 mmHg (10/24 0537) SpO2:  [98 %] 98 % (10/24 0537) Last BM Date: 12/25/14   Radiology Results LEFT ANKLE COMPLETE - 3+ VIEW  COMPARISON: 12/26/2014  FINDINGS: Nondisplaced medial malleolar fracture, unchanged.  No additional fracture is seen.  Ankle mortise is otherwise preserved.  Mild medial soft tissue swelling.  IMPRESSION: Nondisplaced medial malleolar fracture, unchanged.   Electronically Signed  By: Charline BillsSriyesh Krishnan M.D.  On: 12/29/2014 14:19   Physical Exam General appearance: alert and no distress Resp: clear to auscultation bilaterally Cardio: regular rate and rhythm GI: normal findings: bowel sounds normal and soft, non-tender   Assessment/Plan: Fall Concussion w/scalp lac -- Local care TMJ dislocation, temporal fx, ossicular disruption -- Soft diet, R temporal bone fx with hematoma, will need hearing test in 1-2 weeks as an outpatient to evaluate further, Pt having some vertigo with movement as well Multiple right rib fxs -- Pulmonary toilet Left ankle fx -- TDWB, x-rays look good, anticipate no role for surgery Bipolar D/O -- Home meds FEN -- No issues VTE -- SCD's, Lovenox Dispo -- Could be ready for discharge later today after PT/OT and ortho have seen her but going to [facility] and mom may not be ready to get her until tomorrow, will check back after lunch.    Freeman CaldronMichael J. Carrissa Taitano, PA-C Pager: [phone] General Trauma PA Pager: [phone]  12/30/2014

## 2014-12-30 NOTE — Progress Notes (Signed)
Occupational Therapy Treatment Patient Details Name: Janice Rowland MRN: [id number] DOB: 02/13/1991 Today's Date: 12/30/2014    History of present illness pt presents with L Medial Malleolus fx, R Ribs 2-9 fxs, R TMJ Dislocation with spontaneous relocation, and R Temporal fx with inner ear involvement and hearing loss.     OT comments  Pt denied getting out bed due to pain, sleepiness and headache. Therapist educated pt on concussion signs and symptoms and pt was able to verbalize understanding using teach back method. Pt provided with handout to ensure understanding of concussion protocol. Pt appeared emotional and seen crying stating "I am having all these signs and symptoms and I am not able to do my school work or get any sleep". Therapist educated pt in importance of allowing her brain time to heal and that it is normal to experience these symptoms post concussion. Pt educated in tub transfer using simulated shower seat and was able to verbalize understanding regarding same. Pt progressing towards OT goals, will continue to follow.  Follow Up Recommendations  Supervision/Assistance - 24 hour    Equipment Recommendations       Recommendations for Other Services      Precautions / Restrictions Precautions Precautions: Fall Required Braces or Orthoses: Other Brace/Splint Other Brace/Splint: boot on LLE Restrictions Weight Bearing Restrictions: Yes LLE Weight Bearing: Touchdown weight bearing       Mobility Bed Mobility Overal bed mobility: Modified Independent             General bed mobility comments: Pt seen long sitting with HOB elevated upon arrival.  Transfers Overall transfer level: Modified independent               General transfer comment: did not assess, pt denied getting out of bed due to sleepiness, pain and headache    Balance                                   ADL                                         General  ADL Comments: Pt denied getting up from bed due to increase pain, sleepiness and headache. Pt educated pt on usage of shower chair to ensure safety with transfer. Pt educated in wrapping LLE while bathing and wrapping of ace bandage. Pt able to verbalize understanding using teach back method.      Vision                     Perception     Praxis      Cognition   Behavior During Therapy: WFL for tasks assessed/performed Overall Cognitive Status: Within Functional Limits for tasks assessed                       Extremity/Trunk Assessment               Exercises     Shoulder Instructions       General Comments      Pertinent Vitals/ Pain       Pain Assessment: No/denies pain Faces Pain Scale: Hurts little more Pain Location: Ribs and Head Pain Descriptors / Indicators: Aching;Grimacing;Headache Pain Intervention(s): Limited activity within patient's tolerance  Home Living  Prior Functioning/Environment              Frequency Min 2X/week     Progress Toward Goals  OT Goals(current goals can now be found in the care plan section)  Progress towards OT goals: Progressing toward goals  Acute Rehab OT Goals Patient Stated Goal: to be able to do school work OT Goal Formulation: With patient Time For Goal Achievement: 01/05/15 Potential to Achieve Goals: Good ADL Goals Pt Will Perform Lower Body Dressing: with set-up;sit to/from stand Pt Will Transfer to Toilet: with modified independence;ambulating;regular height toilet;grab bars Pt Will Perform Toileting - Clothing Manipulation and hygiene: sit to/from stand;with modified independence Pt Will Perform Tub/Shower Transfer: Tub transfer;ambulating;shower seat;with supervision;rolling walker  Plan Discharge plan remains appropriate    Co-evaluation                 End of Session     Activity Tolerance Patient limited by  fatigue;Patient limited by pain   Patient Left in bed;with call bell/phone within reach   Nurse Communication          Time: [id number] OT Time Calculation (min): 20 min  Charges: OT General Charges $OT Visit: 1 Procedure OT Treatments $Therapeutic Activity: 8-22 mins  Smiley HousemanJames Landon Shakara Tweedy 12/30/2014, 2:13 PM

## 2014-12-30 NOTE — Progress Notes (Signed)
Physical Therapy Treatment Patient Details Name: Janice HaroldLaura Rowland MRN: [id number] DOB: 1990/04/16 Today's Date: 12/30/2014    History of Present Illness pt presents with L Medial Malleolus fx, R Ribs 2-9 fxs, R TMJ Dislocation with spontaneous relocation, and R Temporal fx with inner ear involvement and hearing loss.      PT Comments    Patient is progressing well with mobility. Able to walk in the room on her own. Able to complete stair training. Will go to her mothers house where she has one step to enter and will stay on the first floor. In order to return to class at [employer], patient would benefit from knee scooter due to longer distances.   Follow Up Recommendations  Supervision/Assistance - 24 hour     Equipment Recommendations  Rolling walker with 5" wheels (KN[employer] walker to return to classes)    Recommendations for Other Services       Precautions / Restrictions Precautions Precautions: Fall Required Braces or Orthoses: Other Brace/Splint Other Brace/Splint: boot on LLE Restrictions Weight Bearing Restrictions: Yes LLE Weight Bearing: Touchdown weight bearing    Mobility  Bed Mobility Overal bed mobility: Modified Independent             General bed mobility comments: Pt seen long sitting with HOB elevated upon arrival.  Transfers Overall transfer level: Modified independent               General transfer comment: did not assess, pt denied getting out of bed due to sleepiness, pain and headache  Ambulation/Gait Ambulation/Gait assistance: Supervision Ambulation Distance (Feet): 30 Feet Assistive device: Rolling walker (2 wheeled) Gait Pattern/deviations: Step-to pattern     General Gait Details: Safe use of RW   Stairs Stairs: Yes Stairs assistance: Min guard Stair Management: Step to pattern;Backwards;No rails Number of Stairs: 1 General stair comments: Cues for sequency and technique  Wheelchair Mobility    Modified Rankin (Stroke Patients  Only)       Balance                                    Cognition Arousal/Alertness: Awake/alert Behavior During Therapy: WFL for tasks assessed/performed Overall Cognitive Status: Within Functional Limits for tasks assessed                      Exercises      General Comments        Pertinent Vitals/Pain Pain Assessment: No/denies pain Faces Pain Scale: Hurts little more Pain Location: Ribs and Head Pain Descriptors / Indicators: Aching;Grimacing;Headache Pain Intervention(s): Limited activity within patient's tolerance    Home Living                      Prior Function            PT Goals (current goals can now be found in the care plan section) Acute Rehab PT Goals Patient Stated Goal: to be able to do school work Progress towards PT goals: Progressing toward goals    Frequency  Min 5X/week    PT Plan Current plan remains appropriate    Co-evaluation             End of Session   Activity Tolerance: Patient tolerated treatment well Patient left: in chair;with call bell/phone within reach     Time: [id number] PT Time Calculation (min) (ACUTE ONLY): 20 min  Charges:  $  Gait Training: 8-22 mins                    G Codes:      Fredrich Birks 12/30/2014, 2:06 PM 12/30/2014 Jovana Rembold, Adline Potter PTA

## 2014-12-30 NOTE — Progress Notes (Signed)
Genia HaroldLaura Sheffer to be D/C'd Home per MD order.  Discussed with the patient and all questions fully answered.  VSS, Skin clean, dry and intact without evidence of skin break down, no evidence of skin tears noted. IV catheter discontinued intact. Site without signs and symptoms of complications. Dressing and pressure applied.  An After Visit Summary was printed and given to the patient. Patient received prescription.  D/c education completed with patient/family including follow up instructions, medication list, d/c activities limitations if indicated, with other d/c instructions as indicated by MD - patient able to verbalize understanding, all questions fully answered.   Patient instructed to return to ED, call 911, or call MD for any changes in condition.   Patient escorted via WC, and D/C home via private auto.  Alonza BogusKristie G Berdell Nevitt, RN 12/30/2014 5:55 PM

## 2014-12-30 NOTE — Progress Notes (Signed)
Patient trying to find a hotel to stay the night. Discharged delayed until hotel found. Dr. Janee Mornhompson aware
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
## 2015-04-26 ENCOUNTER — Emergency Department (HOSPITAL_COMMUNITY)
Admission: EM | Admit: 2015-04-26 | Discharge: 2015-04-27 | Disposition: A | Discharge status: Other Acute Inpatient Hospital | Payer: BLUE CROSS/BLUE SHIELD | Attending: Emergency Medicine | Admitting: Emergency Medicine

## 2015-04-26 ENCOUNTER — Encounter (HOSPITAL_COMMUNITY): Payer: Self-pay | Admitting: Nurse Practitioner

## 2015-04-26 DIAGNOSIS — X58XXXA Exposure to other specified factors, initial encounter: Secondary | ICD-10-CM | POA: Diagnosis not present

## 2015-04-26 DIAGNOSIS — Z79899 Other long term (current) drug therapy: Secondary | ICD-10-CM | POA: Diagnosis not present

## 2015-04-26 DIAGNOSIS — Z87828 Personal history of other (healed) physical injury and trauma: Secondary | ICD-10-CM | POA: Diagnosis not present

## 2015-04-26 DIAGNOSIS — Y9389 Activity, other specified: Secondary | ICD-10-CM | POA: Diagnosis not present

## 2015-04-26 DIAGNOSIS — Y9289 Other specified places as the place of occurrence of the external cause: Secondary | ICD-10-CM | POA: Diagnosis not present

## 2015-04-26 DIAGNOSIS — F3131 Bipolar disorder, current episode depressed, mild: Secondary | ICD-10-CM | POA: Insufficient documentation

## 2015-04-26 DIAGNOSIS — F32A Depression, unspecified: Secondary | ICD-10-CM

## 2015-04-26 DIAGNOSIS — T3992XA Poisoning by unspecified nonopioid analgesic, antipyretic and antirheumatic, intentional self-harm, initial encounter: Secondary | ICD-10-CM | POA: Diagnosis not present

## 2015-04-26 DIAGNOSIS — Z3202 Encounter for pregnancy test, result negative: Secondary | ICD-10-CM | POA: Insufficient documentation

## 2015-04-26 DIAGNOSIS — T1491XA Suicide attempt, initial encounter: Secondary | ICD-10-CM

## 2015-04-26 DIAGNOSIS — Y999 Unspecified external cause status: Secondary | ICD-10-CM | POA: Insufficient documentation

## 2015-04-26 DIAGNOSIS — R45851 Suicidal ideations: Secondary | ICD-10-CM | POA: Diagnosis present

## 2015-04-26 DIAGNOSIS — F329 Major depressive disorder, single episode, unspecified: Secondary | ICD-10-CM

## 2015-04-26 LAB — COMPREHENSIVE METABOLIC PANEL
ALK PHOS: 86 U/L (ref 38–126)
ALT: 15 U/L (ref 14–54)
AST: 18 U/L (ref 15–41)
Albumin: 4.4 g/dL (ref 3.5–5.0)
Anion gap: 12 (ref 5–15)
BILIRUBIN TOTAL: 0.4 mg/dL (ref 0.3–1.2)
BUN: 12 mg/dL (ref 6–20)
CALCIUM: 9.2 mg/dL (ref 8.9–10.3)
CHLORIDE: 108 mmol/L (ref 101–111)
CO2: 20 mmol/L — ABNORMAL LOW (ref 22–32)
CREATININE: 0.91 mg/dL (ref 0.44–1.00)
Glucose, Bld: 98 mg/dL (ref 65–99)
Potassium: 3.7 mmol/L (ref 3.5–5.1)
SODIUM: 140 mmol/L (ref 135–145)
TOTAL PROTEIN: 6.7 g/dL (ref 6.5–8.1)

## 2015-04-26 LAB — CBC
HCT: 41.6 % (ref 36.0–46.0)
Hemoglobin: 14.1 g/dL (ref 12.0–15.0)
MCH: 30 pg (ref 26.0–34.0)
MCHC: 33.9 g/dL (ref 30.0–36.0)
MCV: 88.5 fL (ref 78.0–100.0)
PLATELETS: 293 10*3/uL (ref 150–400)
RBC: 4.7 MIL/uL (ref 3.87–5.11)
RDW: 12 % (ref 11.5–15.5)
WBC: 7.5 10*3/uL (ref 4.0–10.5)

## 2015-04-26 LAB — I-STAT BETA HCG BLOOD, ED (MC, WL, AP ONLY)

## 2015-04-26 LAB — ACETAMINOPHEN LEVEL
ACETAMINOPHEN (TYLENOL), SERUM: 33 ug/mL — AB (ref 10–30)
Acetaminophen (Tylenol), Serum: 39 ug/mL — ABNORMAL HIGH (ref 10–30)

## 2015-04-26 LAB — ETHANOL

## 2015-04-26 LAB — SALICYLATE LEVEL

## 2015-04-26 MED ORDER — ONDANSETRON HCL 4 MG PO TABS: 4.0000 mg | ORAL_TABLET | Freq: Three times a day (TID) | ORAL | Status: DC | PRN

## 2015-04-26 MED ORDER — LORAZEPAM 1 MG PO TABS
1.0000 mg | ORAL_TABLET | Freq: Three times a day (TID) | ORAL | Status: DC | PRN
  Administered 2015-04-27: 1 mg via ORAL
  Filled 2015-04-26: qty 1

## 2015-04-26 MED ORDER — SODIUM CHLORIDE 0.9 % IV SOLN
1000.0000 mL | INTRAVENOUS | Status: DC
Start: 2015-04-26 — End: 2015-04-27
  Administered 2015-04-26: 1000 mL via INTRAVENOUS

## 2015-04-26 MED ORDER — ALUM & MAG HYDROXIDE-SIMETH 200-200-20 MG/5ML PO SUSP: 30.0000 mL | ORAL | Status: DC | PRN

## 2015-04-26 MED ORDER — ZOLPIDEM TARTRATE 5 MG PO TABS: 5.0000 mg | ORAL_TABLET | Freq: Every evening | ORAL | Status: DC | PRN

## 2015-04-26 MED ORDER — ONDANSETRON HCL 4 MG/2ML IJ SOLN
4.0000 mg | Freq: Once | INTRAMUSCULAR | Status: AC
  Administered 2015-04-26: 4 mg via INTRAVENOUS
  Filled 2015-04-26: qty 2

## 2015-04-26 MED ORDER — LAMOTRIGINE 25 MG PO TABS: 250.0000 mg | ORAL_TABLET | Freq: Every day | ORAL | Status: DC

## 2015-04-26 MED ORDER — SODIUM CHLORIDE 0.9 % IV SOLN
1000.0000 mL | Freq: Once | INTRAVENOUS | Status: AC
  Administered 2015-04-26: 1000 mL via INTRAVENOUS

## 2015-04-26 MED ORDER — IBUPROFEN 400 MG PO TABS: 600.0000 mg | ORAL_TABLET | Freq: Three times a day (TID) | ORAL | Status: DC | PRN

## 2015-04-26 MED ORDER — BUSPIRONE HCL 10 MG PO TABS
5.0000 mg | ORAL_TABLET | ORAL | Status: DC | PRN
  Administered 2015-04-27: 5 mg via ORAL
  Filled 2015-04-26: qty 1

## 2015-04-26 MED ORDER — HYDROXYZINE HCL 25 MG PO TABS
25.0000 mg | ORAL_TABLET | Freq: Three times a day (TID) | ORAL | Status: DC | PRN
Start: 2015-04-26 — End: 2015-04-27
  Administered 2015-04-27: 25 mg via ORAL
  Filled 2015-04-26: qty 1
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
## 2015-04-26 NOTE — ED Notes (Signed)
Dr Campos in w/pt. 
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
## 2015-04-26 NOTE — ED Notes (Addendum)
Poison Control notified - recommend Tylenol level now and 4 hours post which will be at 2000. Also recommend to monitor pt for 6 hours and perform EKG - Dr Patria Mane aware.

## 2015-04-26 NOTE — BH Assessment (Signed)
Assessment completed.  Consulted with Dahlia Byes, NP who recommends inpatient treatment.  Informed EDP of disposition.   Davina Poke, LCSW Therapeutic Triage Specialist [facility] 04/26/2015 6:47 PM

## 2015-04-26 NOTE — ED Notes (Signed)
Pt in scrubs, wanded by security and sitter present.

## 2015-04-26 NOTE — ED Notes (Addendum)
She took 10- oxycodone about 1 hour ago to try to kill herself. she is crying now, states she really does not want to talk to staff. She denies HI. She denies alcohol or substance use. She is A&Ox4, resp e/u

## 2015-04-26 NOTE — ED Notes (Addendum)
Pt ambulatory to C24 - wearing maroon-colored paper scrubs and socks. Belongings x 1 labeled belongings bag placed at nurses' desk for inventory. Pt refusing to enter room - states "I don't want to go into a room. I don't know how much it's going to cost. The last time I was here, it cost $30,000." Encouraged pt to go into room and talk w/RN - pt did so - sat in chair at bedside - refused to sit on bed. Pt noted to be tearful and advised she did not want to discuss anything. States she came here to make sure she was going to be OK d/t taking Percocet and believes she is fine and wants to leave. Advised pt she is will need to stay to ensure medically cleared and [facility] counselor will be assessing her d/t SI attempt.

## 2015-04-26 NOTE — ED Notes (Signed)
Pt.'s personal belong's / valuables bagged and stored at utility room and security safe.

## 2015-04-26 NOTE — ED Notes (Signed)
TTS aware of order. Advised they will not be given disposition until medically cleared.

## 2015-04-26 NOTE — ED Notes (Signed)
TTS being performed.  

## 2015-04-26 NOTE — ED Notes (Signed)
Pharmacy Tech in w/pt. 
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
## 2015-04-26 NOTE — ED Notes (Signed)
Dr Patria Mane aware pt vomiting x 2. Order received.

## 2015-04-27 ENCOUNTER — Inpatient Hospital Stay (HOSPITAL_COMMUNITY)
Admission: AD | Admit: 2015-04-27 | Discharge: 2015-04-29 | DRG: 885 | Disposition: A | Discharge status: Home / Self Care | Payer: BLUE CROSS/BLUE SHIELD | Source: Intra-hospital | Attending: Psychiatry | Admitting: Psychiatry

## 2015-04-27 ENCOUNTER — Encounter (HOSPITAL_COMMUNITY): Payer: Self-pay

## 2015-04-27 DIAGNOSIS — F319 Bipolar disorder, unspecified: Secondary | ICD-10-CM | POA: Diagnosis present

## 2015-04-27 DIAGNOSIS — R45851 Suicidal ideations: Secondary | ICD-10-CM | POA: Diagnosis present

## 2015-04-27 DIAGNOSIS — Z79891 Long term (current) use of opiate analgesic: Secondary | ICD-10-CM | POA: Diagnosis not present

## 2015-04-27 DIAGNOSIS — F1190 Opioid use, unspecified, uncomplicated: Secondary | ICD-10-CM | POA: Diagnosis not present

## 2015-04-27 DIAGNOSIS — F411 Generalized anxiety disorder: Secondary | ICD-10-CM | POA: Diagnosis present

## 2015-04-27 DIAGNOSIS — Z818 Family history of other mental and behavioral disorders: Secondary | ICD-10-CM

## 2015-04-27 DIAGNOSIS — F3131 Bipolar disorder, current episode depressed, mild: Secondary | ICD-10-CM | POA: Diagnosis not present

## 2015-04-27 DIAGNOSIS — G4700 Insomnia, unspecified: Secondary | ICD-10-CM | POA: Diagnosis present

## 2015-04-27 LAB — RAPID URINE DRUG SCREEN, HOSP PERFORMED
AMPHETAMINES: NOT DETECTED
BARBITURATES: NOT DETECTED
BENZODIAZEPINES: NOT DETECTED
Cocaine: NOT DETECTED
Opiates: POSITIVE — AB
TETRAHYDROCANNABINOL: NOT DETECTED

## 2015-04-27 MED ORDER — CLONIDINE HCL 0.1 MG PO TABS
0.1000 mg | ORAL_TABLET | Freq: Every day | ORAL | Status: DC
  Filled 2015-04-27: qty 1

## 2015-04-27 MED ORDER — LAMOTRIGINE 25 MG PO TABS
ORAL_TABLET | ORAL | Status: AC
  Filled 2015-04-27: qty 2

## 2015-04-27 MED ORDER — ONDANSETRON 4 MG PO TBDP: 4.0000 mg | ORAL_TABLET | Freq: Four times a day (QID) | ORAL | Status: DC | PRN

## 2015-04-27 MED ORDER — NAPROXEN 500 MG PO TABS: 500.0000 mg | ORAL_TABLET | Freq: Two times a day (BID) | ORAL | Status: DC | PRN

## 2015-04-27 MED ORDER — LAMOTRIGINE 150 MG PO TABS
150.0000 mg | ORAL_TABLET | Freq: Two times a day (BID) | ORAL | Status: DC
  Administered 2015-04-27 – 2015-04-29 (×6): 150 mg via ORAL
  Filled 2015-04-27 (×9): qty 1

## 2015-04-27 MED ORDER — LOPERAMIDE HCL 2 MG PO CAPS: 2.0000 mg | ORAL_CAPSULE | ORAL | Status: DC | PRN

## 2015-04-27 MED ORDER — CLONIDINE HCL 0.1 MG PO TABS
0.1000 mg | ORAL_TABLET | Freq: Four times a day (QID) | ORAL | Status: AC
Start: 2015-04-27 — End: 2015-04-29
  Administered 2015-04-27: 0.1 mg via ORAL
  Filled 2015-04-27 (×7): qty 1

## 2015-04-27 MED ORDER — DICYCLOMINE HCL 20 MG PO TABS: 20.0000 mg | ORAL_TABLET | Freq: Four times a day (QID) | ORAL | Status: DC | PRN

## 2015-04-27 MED ORDER — BUPROPION HCL 75 MG PO TABS
75.0000 mg | ORAL_TABLET | Freq: Every day | ORAL | Status: DC
  Administered 2015-04-27 – 2015-04-28 (×2): 75 mg via ORAL
  Filled 2015-04-27 (×3): qty 1

## 2015-04-27 MED ORDER — LAMOTRIGINE 100 MG PO TABS
ORAL_TABLET | ORAL | Status: AC
  Filled 2015-04-27: qty 1

## 2015-04-27 MED ORDER — BUSPIRONE HCL 10 MG PO TABS
10.0000 mg | ORAL_TABLET | Freq: Three times a day (TID) | ORAL | Status: DC
  Administered 2015-04-27 – 2015-04-29 (×9): 10 mg via ORAL
  Filled 2015-04-27: qty 2
  Filled 2015-04-27 (×13): qty 1

## 2015-04-27 MED ORDER — CLONIDINE HCL 0.1 MG PO TABS
0.1000 mg | ORAL_TABLET | ORAL | Status: DC
Start: 2015-04-29 — End: 2015-04-29
  Filled 2015-04-27 (×4): qty 1

## 2015-04-27 MED ORDER — TRAZODONE HCL 50 MG PO TABS
50.0000 mg | ORAL_TABLET | Freq: Every day | ORAL | Status: DC
  Administered 2015-04-27 – 2015-04-28 (×2): 50 mg via ORAL
  Filled 2015-04-27 (×5): qty 1

## 2015-04-27 MED ORDER — HYDROXYZINE HCL 10 MG PO TABS: 10.0000 mg | ORAL_TABLET | Freq: Three times a day (TID) | ORAL | Status: DC | PRN

## 2015-04-27 MED ORDER — METHOCARBAMOL 500 MG PO TABS: 500.0000 mg | ORAL_TABLET | Freq: Three times a day (TID) | ORAL | Status: DC | PRN
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
## 2015-04-27 NOTE — [facility] Group Notes (Signed)
[facility] LCSW Group Therapy Note   04/27/2015  10 AM   Type of Therapy and Topic: Group Therapy: Feelings Around Returning Home & Establishing a Supportive Framework and Activity to Identify signs of Improvement or Decompensation   Participation Level:  Did not attend although encouraged by MHT and CSW in addition to overhead announcement.   Linzee Depaul C Flem Enderle, LCSW     
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
## 2015-04-27 NOTE — Tx Team (Addendum)
Initial Interdisciplinary Treatment Plan   PATIENT STRESSORS: Educational concerns Loss of Relationship   PATIENT STRENGTHS: Capable of independent living Communication skills Physical Health Supportive family/friends Work skills   PROBLEM LIST: Problem List/Patient Goals Date to be addressed Date deferred Reason deferred Estimated date of resolution  "I need help with moving on with my life" 04/27/2015     Help dealing with my stress at school" 04/27/2015     Anxiety 04/27/2015     Depression 04/27/2015     Risk for suicide 04/27/2015                              DISCHARGE CRITERIA:  Ability to meet basic life and health needs Reduction of life-threatening or endangering symptoms to within safe limits Verbal commitment to aftercare and medication compliance  PRELIMINARY DISCHARGE PLAN: Return to previous living arrangement Return to previous work or school arrangements  PATIENT/FAMIILY INVOLVEMENT: This treatment plan has been presented to and reviewed with the patient, Janice Rowland, and/or family member.  The patient and family have been given the opportunity to ask questions and make suggestions.  [name] 04/27/2015, 6:33 AM

## 2015-04-27 NOTE — ED Notes (Signed)
Report called to Tobi RN at [facility] health adult unit rm. 300 .

## 2015-04-27 NOTE — Progress Notes (Signed)
Patient ID: Janice Rowland, female   DOB: 1991/02/23, 25 y.o.   MRN: [id number]   D: Pt has been very flat and depressed on the unit today. Pt did not attend any groups nor did she engage in any treatment. Pt reported that she didn't want to come out of her room unless the students were off the hall, all students were removed from hall. Pt reported that her energy was low. Pt reported that her depression was a 5, her hopelessness was a 5, and her anxiety was a 5. Pt reported that his goal for today was to get sleep. Pt reported being negative SI/HI, no AH/VH noted. A: 15 min checks continued for patient safety. R: Pt safety maintained.

## 2015-04-27 NOTE — Progress Notes (Signed)
Admission Note  Pt is a 25 y/o Caucasian female admitted to adult I/P complaining of severe depression and anxiety with some anger. She state, "I can't believe I'm in a place like this-feels like am in jail; just anger about the whole situation." Pt denies pain, SI, HI and AVH. Pt's goals are "I need help with moving on with my life" and "I need help dealing with my stress at school." Support, encouragement, and safe environment provided.  15-minute safety checks initiated and continued. Pt remained calm and cooperative through the admission process.

## 2015-04-27 NOTE — ED Notes (Addendum)
Pt. personal belongings/valuables released from security safe . [place] transport service called for transport. Pt. signed consent form to transport to [facility].

## 2015-04-27 NOTE — BHH Suicide Risk Assessment (Signed)
[facility] Admission Suicide Risk Assessment   Nursing information obtained from:  Patient Demographic factors:  Adolescent or young adult, Caucasian Current Mental Status:  Intention to act on suicide plan, Self-harm thoughts Loss Factors:  Loss of significant relationship Historical Factors:  Prior suicide attempts Risk Reduction Factors:  Living with another person, especially a relative  Total Time spent with patient: 1.5 hours Principal Problem: Bipolar 1 disorder (HCC) Diagnosis:   Patient Active Problem List   Diagnosis Date Noted  . Bipolar 1 disorder (HCC) [F31.9] 04/27/2015  . Temporal bone fracture (HCC) [S02.19XA] 12/30/2014  . Traumatic dislocation of ossicle of right ear [H74.21] 12/30/2014  . Fall [W19.XXXA] 12/27/2014  . TMJ dislocation [S03.0XXA] 12/27/2014  . Scalp laceration [S01.01XA] 12/27/2014  . Concussion [S06.0X9A] 12/27/2014  . Ankle fracture, left [S82.892A] 12/27/2014  . Multiple abrasions [T14.8] 12/27/2014  . Fracture of multiple ribs [S22.49XA] 12/26/2014   Subjective Data: alert oriented but concerned of being here. Says not suicidal. Agitated that she is here and has to be back with her work and school soon. Slept poorly . Mood remains upset.  Continued Clinical Symptoms:  Alcohol Use Disorder Identification Test Final Score (AUDIT): 0 The "Alcohol Use Disorders Identification Test", Guidelines for Use in Primary Care, Second Edition.  World Science writer [facility]). Score between 0-7:  no or low risk or alcohol related problems. Score between 8-15:  moderate risk of alcohol related problems. Score between 16-19:  high risk of alcohol related problems. Score 20 or above:  warrants further diagnostic evaluation for alcohol dependence and treatment.   CLINICAL FACTORS:   Bipolar Disorder:   Depressive phase Depression:   Anhedonia Impulsivity More than one psychiatric diagnosis Unstable or Poor Therapeutic Relationship   Musculoskeletal: Strength  & Muscle Tone: within normal limits Gait & Station: normal Patient leans: N/A  Psychiatric Specialty Exam: Review of Systems  Constitutional: Negative for fever.  Cardiovascular: Negative for chest pain.  Skin: Negative for rash.  Neurological: Negative for tremors.  Psychiatric/Behavioral: Positive for depression.    Blood pressure 103/69, pulse 72, temperature 98.8 F (37.1 C), temperature source Oral, resp. rate 16, height 5' (1.524 m), weight 50.349 kg (111 lb), SpO2 100 %.Body mass index is 21.68 kg/(m^2).  General Appearance: Casual  Eye Contact::  Fair  Speech:  Normal Rate  Volume:  Normal  Mood:  Dysphoric and Irritable  Affect:  Congruent  Thought Process:  Coherent  Orientation:  Full (Time, Place, and Person)  Thought Content:  Rumination  Suicidal Thoughts:  No  Homicidal Thoughts:  No  Memory:  Immediate;   Fair Recent;   Fair  Judgement:  Poor  Insight:  Shallow  Psychomotor Activity:  Normal  Concentration:  Fair  Recall:  [employer] of Knowledge:Fair  Language: fair  Akathisia:  Negative  Handed:  Right  AIMS (if indicated):     Assets:  Desire for Improvement  Sleep:     Cognition: WNL  ADL's:  Intact    COGNITIVE FEATURES THAT CONTRIBUTE TO RISK:  Closed-mindedness    SUICIDE RISK:   Moderate:  Frequent suicidal ideation with limited intensity, and duration, some specificity in terms of plans, no associated intent, good self-control, limited dysphoria/symptomatology, some risk factors present, and identifiable protective factors, including available and accessible social support.  PLAN OF CARE: Admit for stabilization , medication management. Observe and treat withdrawals .   I certify that inpatient services furnished can reasonably be expected to improve the patient's condition.   Sunaina Ferrando,  Patsye Sullivant, MD 04/27/2015, 10:17 AM

## 2015-04-27 NOTE — BHH Group Notes (Signed)
BHH Group Notes:  (Nursing/MHT/Case Management/Adjunct)  Date:  04/27/2015  Time:  10:34 AM  Type of Therapy:  Psychoeducational Skills  Participation Level:  Did Not Attend  Participation Quality:  Did Not Attend  Affect:  Did Not Attend  Cognitive:  Did Not Attend  Insight:  None  Engagement in Group:  Did Not Attend  Modes of Intervention:  Did Not Attend  Summary of Progress/Problems: Pt did not attend patient self inventory group.   Nayvie Lips Shanta 04/27/2015, 10:34 AM 

## 2015-04-27 NOTE — BH Assessment (Signed)
Binnie Rail, [facility] said room 400-2 is now available for Pt. Dahlia Byes, NP accepted for Dr. Jama Flavors. Notified Dr. Rhunette Croft and Reita Cliche, RN of acceptance.   Harlin Rain Patsy [place], LPC, [facility], [facility] Triage Specialist [phone]

## 2015-04-28 NOTE — BHH Group Notes (Signed)
   [facility] LCSW Aftercare Discharge Planning Group Note  04/28/2015  8:45 AM   Participation Quality: Alert, Appropriate and Oriented  Mood/Affect: Anxious  Depression Rating: 2-3  Anxiety Rating: 3  Thoughts of Suicide: Pt denies SI/HI  Will you contract for safety? Yes  Current AVH: Pt denies  Plan for Discharge/Comments: Pt attended discharge planning group and actively participated in group. CSW provided pt with today's workbook. Patient plans to return home to follow up with outpatient services.   Transportation Means: Pt reports access to transportation  Supports: No supports mentioned at this time  Samuella Bruin, MSW, [employer] Clinical Social Worker [employer] [phone]

## 2015-04-28 NOTE — Progress Notes (Signed)
Writer spoke with patient 1:1 and she reports that her day has been ok but she feels that she does not and can't afford to be here. She reports that she needs to be discharged by 8 am d/t an exam for school on tomorrow. Writer informed her that this would not be possible and offered suggestions as to how she can work on handling her stress differently. She was given a journal for her to write down her goals to work toward at a slower pace but still complete the big picture. She was receptive and appreciative. She attended wrap up group and requested medication to aid with sleep. Safety maintained on unit with 15 min checks.

## 2015-04-28 NOTE — Progress Notes (Signed)
D: Pt presents with flat affect and depressed mood. Pt cautious during shift assessment and appeared to be minimizing. Pt denies depression and suicidal thoughts. Pt denies withdrawal symptoms. Pt refused to take Clonidine this morning and afternoon. Pt anxious during shift assessment and verbalized concerns about missing class today. Pt appears to have no insight for tx.  A: Medications reviewed with pt. Medications administered as ordered per MD. Verbal support provided. Pt encouraged to attend groups. 15 minute checks performed for safety.  R: Pt stated goal "make a plan on what school work I'm going to have to catch up on". Pt safety maintained.

## 2015-04-28 NOTE — BHH Group Notes (Signed)
[facility] LCSW Group Therapy 04/28/2015  1:15 PM   Type of Therapy: Group Therapy  Participation Level: Did Not Attend. Patient invited to participate but declined.   Samuella Bruin, MSW, LCSW Clinical Social Worker [facility] [phone]

## 2015-04-28 NOTE — Progress Notes (Signed)
Group Note  Pt attended wrap-up group; participated actively. Appropriate behavior. 

## 2015-04-28 NOTE — Progress Notes (Signed)
[facility] MD Progress Note  04/28/2015 2:13 PM Janice Rowland  MRN:  [id number] Subjective:  Patient reports " I am fine, I am just ready to go, before I get kicked out of school" States that my mother will come and pick me up and bring me back."   Objective:Janice Rowland is awake, alert and oriented X3 , found resting in her bedroom.  Per staff patient is isolative.  Denies suicidal or homicidal ideation. Denies auditory or visual hallucination and does not appear to be responding to internal stimuli. Patient interacts well with staff and others. Patient reports she is medication compliant without mediation side effects. . States her depression 2/10. Patient states "I am fine, I am just ready to go, before I get kicked out of school" States that my mother will come and pick me up and bring me back." Reports fair appetite  and resting well. Support, encouragement and reassurance was provided.   Principal Problem: Bipolar 1 disorder ([place]) Diagnosis:   Patient Active Problem List   Diagnosis Date Noted  . Bipolar 1 disorder ([place]) [F31.9] 04/27/2015  . Opiate use [F11.90]   . Temporal bone fracture ([place]) [S02.19XA] 12/30/2014  . Traumatic dislocation of ossicle of right ear [H74.21] 12/30/2014  . Fall [W19.XXXA] 12/27/2014  . TMJ dislocation [S03.0XXA] 12/27/2014  . Scalp laceration [S01.01XA] 12/27/2014  . Concussion [S06.0X9A] 12/27/2014  . Ankle fracture, left [S82.892A] 12/27/2014  . Multiple abrasions [T14.8] 12/27/2014  . Fracture of multiple ribs [S22.49XA] 12/26/2014   Total Time spent with patient: 30 minutes  Past Psychiatric History: See above  Past Medical History:  Past Medical History  Diagnosis Date  . Bipolar 1 disorder ([place])    History reviewed. No pertinent past surgical history. Family History: History reviewed. No pertinent family history. Family Psychiatric  History: unknown Social History:  History  Alcohol Use No    Comment: "occasional"     History  Drug Use No     Comment: denies    Social History   Social History  . Marital Status: Single    Spouse Name: N/A  . Number of Children: N/A  . Years of Education: N/A   Social History Main Topics  . Smoking status: Never Smoker   . Smokeless tobacco: Never Used  . Alcohol Use: No     Comment: "occasional"  . Drug Use: No     Comment: denies  . Sexual Activity: Not Asked   Other Topics Concern  . None   Social History Narrative   Additional Social History:    Pain Medications: See PTA Prescriptions: See PTA Over the Counter: See PTA History of alcohol / drug use?: No history of alcohol / drug abuse Negative Consequences of Use: Work / School                    Sleep: Fair  Appetite:  Fair  Current Medications: Current Facility-Administered Medications  Medication Dose Route Frequency Provider Last Rate Last Dose  . busPIRone (BUSPAR) tablet 10 mg  10 mg Oral TID Lurena Nida, NP   10 mg at 04/28/15 1211  . cloNIDine (CATAPRES) tablet 0.1 mg  0.1 mg Oral QID Derrill Center, NP   0.1 mg at 04/27/15 1118   Followed by  . [START ON 04/29/2015] cloNIDine (CATAPRES) tablet 0.1 mg  0.1 mg Oral BH-qamhs Derrill Center, NP       Followed by  . [START ON 05/01/2015] cloNIDine (CATAPRES) tablet 0.1 mg  0.1  mg Oral QAC breakfast Derrill Center, NP      . dicyclomine (BENTYL) tablet 20 mg  20 mg Oral Q6H PRN Derrill Center, NP      . hydrOXYzine (ATARAX/VISTARIL) tablet 10 mg  10 mg Oral TID PRN Lurena Nida, NP      . lamoTRIgine (LAMICTAL) tablet 150 mg  150 mg Oral BID Lurena Nida, NP   150 mg at 04/28/15 0834  . loperamide (IMODIUM) capsule 2-4 mg  2-4 mg Oral PRN Derrill Center, NP      . methocarbamol (ROBAXIN) tablet 500 mg  500 mg Oral Q8H PRN Derrill Center, NP      . naproxen (NAPROSYN) tablet 500 mg  500 mg Oral BID PRN Derrill Center, NP      . ondansetron (ZOFRAN-ODT) disintegrating tablet 4 mg  4 mg Oral Q6H PRN Derrill Center, NP      . traZODone (DESYREL) tablet  50 mg  50 mg Oral QHS Derrill Center, NP   50 mg at 04/27/15 2228    Lab Results:  Results for orders placed or performed during the hospital encounter of 04/26/15 (from the past 48 hour(s))  I-Stat beta hCG blood, ED ([facility], [facility], [facility] only)     Status: None   Collection Time: 04/26/15  4:52 PM  Result Value Ref Range   I-stat hCG, quantitative <5.0 <5 mIU/mL   Comment 3            Comment:   GEST. AGE      CONC.  (mIU/mL)   <=1 WEEK        5 - 50     2 WEEKS       50 - 500     3 WEEKS       100 - 10,000     4 WEEKS     1,000 - 30,000        FEMALE AND NON-PREGNANT FEMALE:     LESS THAN 5 mIU/mL   Comprehensive metabolic panel     Status: Abnormal   Collection Time: 04/26/15  4:59 PM  Result Value Ref Range   Sodium 140 135 - 145 mmol/L   Potassium 3.7 3.5 - 5.1 mmol/L   Chloride 108 101 - 111 mmol/L   CO2 20 (L) 22 - 32 mmol/L   Glucose, Bld 98 65 - 99 mg/dL   BUN 12 6 - 20 mg/dL   Creatinine, Ser 0.91 0.44 - 1.00 mg/dL   Calcium 9.2 8.9 - 10.3 mg/dL   Total Protein 6.7 6.5 - 8.1 g/dL   Albumin 4.4 3.5 - 5.0 g/dL   AST 18 15 - 41 U/L   ALT 15 14 - 54 U/L   Alkaline Phosphatase 86 38 - 126 U/L   Total Bilirubin 0.4 0.3 - 1.2 mg/dL   GFR calc non Af Amer >60 >60 mL/min   GFR calc Af Amer >60 >60 mL/min    Comment: (NOTE) The eGFR has been calculated using the CKD EPI equation. This calculation has not been validated in all clinical situations. eGFR's persistently <60 mL/min signify possible Chronic Kidney Disease.    Anion gap 12 5 - 15  Ethanol (ETOH)     Status: None   Collection Time: 04/26/15  4:59 PM  Result Value Ref Range   Alcohol, Ethyl (B) <5 <5 mg/dL    Comment:        LOWEST DETECTABLE LIMIT FOR SERUM ALCOHOL  IS 5 mg/dL FOR MEDICAL PURPOSES ONLY   Salicylate level     Status: None   Collection Time: 04/26/15  4:59 PM  Result Value Ref Range   Salicylate Lvl <2.2 2.8 - 30.0 mg/dL  Acetaminophen level     Status: Abnormal   Collection Time: 04/26/15  4:59  PM  Result Value Ref Range   Acetaminophen (Tylenol), Serum 39 (H) 10 - 30 ug/mL    Comment:        THERAPEUTIC CONCENTRATIONS VARY SIGNIFICANTLY. A RANGE OF 10-30 ug/mL MAY BE AN EFFECTIVE CONCENTRATION FOR MANY PATIENTS. HOWEVER, SOME ARE BEST TREATED AT CONCENTRATIONS OUTSIDE THIS RANGE. ACETAMINOPHEN CONCENTRATIONS >150 ug/mL AT 4 HOURS AFTER INGESTION AND >50 ug/mL AT 12 HOURS AFTER INGESTION ARE OFTEN ASSOCIATED WITH TOXIC REACTIONS.   CBC     Status: None   Collection Time: 04/26/15  4:59 PM  Result Value Ref Range   WBC 7.5 4.0 - 10.5 K/uL   RBC 4.70 3.87 - 5.11 MIL/uL   Hemoglobin 14.1 12.0 - 15.0 g/dL   HCT 41.6 36.0 - 46.0 %   MCV 88.5 78.0 - 100.0 fL   MCH 30.0 26.0 - 34.0 pg   MCHC 33.9 30.0 - 36.0 g/dL   RDW 12.0 11.5 - 15.5 %   Platelets 293 150 - 400 K/uL  Acetaminophen level     Status: Abnormal   Collection Time: 04/26/15  8:04 PM  Result Value Ref Range   Acetaminophen (Tylenol), Serum 33 (H) 10 - 30 ug/mL    Comment:        THERAPEUTIC CONCENTRATIONS VARY SIGNIFICANTLY. A RANGE OF 10-30 ug/mL MAY BE AN EFFECTIVE CONCENTRATION FOR MANY PATIENTS. HOWEVER, SOME ARE BEST TREATED AT CONCENTRATIONS OUTSIDE THIS RANGE. ACETAMINOPHEN CONCENTRATIONS >150 ug/mL AT 4 HOURS AFTER INGESTION AND >50 ug/mL AT 12 HOURS AFTER INGESTION ARE OFTEN ASSOCIATED WITH TOXIC REACTIONS.   Urine rapid drug screen (hosp performed) (Not at [facility])     Status: Abnormal   Collection Time: 04/27/15 12:23 AM  Result Value Ref Range   Opiates POSITIVE (A) NONE DETECTED   Cocaine NONE DETECTED NONE DETECTED   Benzodiazepines NONE DETECTED NONE DETECTED   Amphetamines NONE DETECTED NONE DETECTED   Tetrahydrocannabinol NONE DETECTED NONE DETECTED   Barbiturates NONE DETECTED NONE DETECTED    Comment:        DRUG SCREEN FOR MEDICAL PURPOSES ONLY.  IF CONFIRMATION IS NEEDED FOR ANY PURPOSE, NOTIFY LAB WITHIN 5 DAYS.        LOWEST DETECTABLE LIMITS FOR URINE DRUG  SCREEN Drug Class       Cutoff (ng/mL) Amphetamine      1000 Barbiturate      200 Benzodiazepine   979 Tricyclics       892 Opiates          300 Cocaine          300 THC              50     Blood Alcohol level:  Lab Results  Component Value Date   ETH <5 04/26/2015   ETH <5 12/26/2014    Physical Findings: AIMS: Facial and Oral Movements Muscles of Facial Expression: None, normal Lips and Perioral Area: None, normal Jaw: None, normal Tongue: None, normal,Extremity Movements Upper (arms, wrists, hands, fingers): None, normal Lower (legs, knees, ankles, toes): None, normal, Trunk Movements Neck, shoulders, hips: None, normal, Overall Severity Severity of abnormal movements (highest score from questions above): None, normal Incapacitation due  to abnormal movements: None, normal Patient's awareness of abnormal movements (rate only patient's report): No Awareness, Dental Status Current problems with teeth and/or dentures?: No Does patient usually wear dentures?: No  CIWA:  CIWA-Ar Total: 2 COWS:  COWS Total Score: 0  Musculoskeletal: Strength & Muscle Tone: within normal limits Gait & Station: normal Patient leans: N/A  Psychiatric Specialty Exam: Review of Systems  Psychiatric/Behavioral: Positive for depression. Negative for suicidal ideas and hallucinations. The patient is nervous/anxious. The patient does not have insomnia.   All other systems reviewed and are negative.   Blood pressure 104/62, pulse 77, temperature 98.8 F (37.1 C), temperature source Oral, resp. rate 16, height 5' (1.524 m), weight 50.349 kg (111 lb), SpO2 100 %.Body mass index is 21.68 kg/(m^2).  General Appearance: Guarded  Eye Contact::  Fair  Speech:  Clear and Coherent  Volume:  Normal  Mood:  Anxious, Depressed and Irritable  Affect:  Depressed and Flat  Thought Process:  Goal Directed   Orientation:  Full (Time, Place, and Person)  Thought Content:  Rumination  Suicidal Thoughts:  No   Homicidal Thoughts:  No  Memory:  Immediate;   Fair Recent;   Fair Remote;   Fair  Judgement:  Poor  Insight:  Lacking and Shallow  Psychomotor Activity:  Restlessness   Concentration:  Fair  Recall:  Poor  Fund of Knowledge:Poor  Language: Poor  Akathisia:  No  Handed:  Right  AIMS (if indicated):     Assets:  Desire for Improvement Vocational/Educational  ADL's:  Intact  Cognition: WNL  Sleep:  Number of Hours: 6.75     I agree with current treatment plan on 04/28/2015, Patient seen face-to-face for psychiatric evaluation follow-up, chart reviewed. Plan discussed with MD.Eappen.  Reviewed the information documented and agree with the treatment plan.  Treatment Plan Summary: Daily contact with patient to assess and evaluate symptoms and progress in treatment and Medication management   Discontinue Wellbutrin 39m PO daily with titration for anxiety/depression Continue Buspar 10 mg PO TID for anxiety Continue with Lamictal 150 mg PO BID for mood stabilization. Continue with Trazodone 50 mg for insomnia Labs: Lamotrigine level to be collected 04/28/15 Started on COWS Protocol- Clonidine for Opioid detox Will continue to monitor vitals ,medication compliance and treatment side effects while patient is here.  Reviewed labs Glucose 98 WNL ,BAL - 0, UDS - positive Opiates CSW will start working on disposition.  Patient to participate in therapeutic milieu   TDerrill Center NP 04/28/2015, 2:13 PM I agree with assessment and plan IGeralyn FlashA. LSabra Heck M.D.

## 2015-04-29 MED ORDER — BUSPIRONE HCL 10 MG PO TABS: 10.0000 mg | ORAL_TABLET | Freq: Three times a day (TID) | ORAL | Status: AC

## 2015-04-29 MED ORDER — TRAZODONE HCL 50 MG PO TABS: 50.0000 mg | ORAL_TABLET | Freq: Every day | ORAL | Status: DC

## 2015-04-29 MED ORDER — HYDROXYZINE HCL 10 MG PO TABS: 10.0000 mg | ORAL_TABLET | Freq: Three times a day (TID) | ORAL | Status: AC | PRN

## 2015-04-29 MED ORDER — LAMOTRIGINE 150 MG PO TABS: 150.0000 mg | ORAL_TABLET | Freq: Two times a day (BID) | ORAL | Status: DC

## 2015-04-29 NOTE — Progress Notes (Signed)
D: Patient observed in room visiting with mother the beginning of shift. Patient did engage in conversation with this Clinical research associate after visitation. Patient states she is here because she did " not want to hurt anymore emotionally." Patient states she " took oxycodone with intent to harm self. " Patient shared she was in a relationship that was not good for her emotionally. Patient states being here has helped. Patient allowed to express her feelings and coping techniques talked about once discharged. A: Support and encouragement offered. Q 15 minute checks in progress and maintained.  R: Patient remains safe on unit and monitoring continues.

## 2015-04-29 NOTE — BHH Suicide Risk Assessment (Addendum)
[employer] Discharge Suicide Risk Assessment   Principal Problem: Bipolar 1 disorder [facility]) Discharge Diagnoses:  Patient Active Problem List   Diagnosis Date Noted  . Bipolar 1 disorder (HCC) [F31.9] 04/27/2015  . Opiate use [F11.90]   . Temporal bone fracture (HCC) [S02.19XA] 12/30/2014  . Traumatic dislocation of ossicle of right ear [H74.21] 12/30/2014  . Fall [W19.XXXA] 12/27/2014  . TMJ dislocation [S03.0XXA] 12/27/2014  . Scalp laceration [S01.01XA] 12/27/2014  . Concussion [S06.0X9A] 12/27/2014  . Ankle fracture, left [S82.892A] 12/27/2014  . Multiple abrasions [T14.8] 12/27/2014  . Fracture of multiple ribs [S22.49XA] 12/26/2014    Total Time spent with patient: 30 minutes  Musculoskeletal: Strength & Muscle Tone: within normal limits Gait & Station: normal Patient leans: N/A  Psychiatric Specialty Exam: ROS  Blood pressure 98/63, pulse 98, temperature 98.6 F (37 C), temperature source Oral, resp. rate 16, height 5' (1.524 m), weight 111 lb (50.349 kg), SpO2 100 %.Body mass index is 21.68 kg/(m^2).  General Appearance: Well Groomed  Eye Contact::  Good  Speech:  Normal Rate409  Volume:  Normal  Mood:  states she feels better, minimizes depression at this time  Affect:  Appropriate and reactive   Thought Process:  Linear  Orientation:  Full (Time, Place, and Person)  Thought Content:  denies hallucinations,no delusions  Suicidal Thoughts:  No- denies any suicidal ideations, denies any self injurious ideations  Homicidal Thoughts:  No denies any violent or homicidal ideations   Memory: recent and remote grossly intact   Judgement:  Other:  improving   Insight:  improving   Psychomotor Activity:  Normal  Concentration:  Good  Recall:  Good  Fund of Knowledge:Good  Language: Good  Akathisia:  Negative  Handed:  Right  AIMS (if indicated):     Assets:  Communication Skills Desire for Improvement Resilience  Sleep:  Number of Hours: 6  Cognition: WNL  ADL's:   Intact   Mental Status Per Nursing Assessment::   On Admission:  Intention to act on suicide plan, Self-harm thoughts  Demographic Factors:  25 year old single female,  In college, employed   Loss Factors: Stressful relationship with BF, academic difficulties, dealing with competing issues regarding work, college and personal time   Historical Factors: Denies prior history of suicide attempts, denies prior history of psychiatric admissions - states she has been diagnosed with Bipolar Disorder and with ADHD in the past- on Lamictal for several months to years, feels it works well for her, denies side effects Denies drug or alcohol abuse   Risk Reduction Factors:   Sense of responsibility to family, Employed, Living with another person, especially a relative, Positive social support and Positive coping skills or problem solving skills  Continued Clinical Symptoms:  At this time patient reports feeling better, she presents fully alert and attentive, no thought disorder, states mood is " better", and affect is reactive, no thought disorder, no SI or HI, no self injurious ideations, no psychotic symptoms, future oriented, hoping to return to college/work soon.  Of note, patient does not endorse any symptoms of opiate WDL and states opiate use was isolated instance , rather than any pattern of abuse- is not endorsing or presenting with any symptoms of opiate WDL at this time. Will D/C Clonidine taper  Patient identifies mother as closest support and states she plans to stay with mother for the remainder of the week for added support, and plans to return to work and to college as of next Monday . At  patient's request and in her presence I spoke with her mother via phone - mother has been visiting patient daily and corroborates that patient seems improved and feels patient is ready for discharge . Mother states patient will be staying with her over the next several days .  We discussed adding  antidepressant medication to Lamictal prior- patient did not want to add antidepressant at this time.   Cognitive Features That Contribute To Risk:  No gross cognitive deficits noted upon discharge. Is alert , attentive, and oriented x 3     Suicide Risk:  Mild:  Suicidal ideation of limited frequency, intensity, duration, and specificity.  There are no identifiable plans, no associated intent, mild dysphoria and related symptoms, good self-control (both objective and subjective assessment), few other risk factors, and identifiable protective factors, including available and accessible social support.  Follow-up Information    Follow up with [facility] the [place] On 05/02/2015.   Why:  Therapy appointment on Friday January 24th at 11am with Roxine Caddy. Call office if you need to reschedule.    Contact information:   [address], [place] [address] Phone: [phone] Fax: [phone]      Follow up with [facility].   Why:  social worker will call you with appointment information for crisis management meeting with Genia Del and for upcoming medication management appointment.   Contact information:   [address] [place], [place] [address] [phone]      Plan Of Care/Follow-up recommendations:  Activity:  as tolerated  Diet:  Regular Tests:  NA Other:  See below Patient is requesting discharge- there are no current grounds for involuntary commitment . She is leaving in good spirits. Patient states mother will pick her up later today . She is planning on staying with mother, whom she identifies as very supportive, until next week and then return to college  She plans to follow up as above . Nehemiah Massed, MD 04/29/2015, 3:24 PM

## 2015-04-29 NOTE — Tx Team (Addendum)
Interdisciplinary Treatment Plan Update (Adult) Date: 04/29/2015    Time Reviewed: 9:30 AM  Progress in Treatment: Attending groups: No Participating in groups: No Taking medication as prescribed: Yes Tolerating medication: Yes Family/Significant other contact made: No, CSW assessing for appropriate contacts Patient understands diagnosis: Yes Discussing patient identified problems/goals with staff: Yes Medical problems stabilized or resolved: Yes Denies suicidal/homicidal ideation: Yes Issues/concerns per patient self-inventory: Yes Other:  New problem(s) identified: N/A  Discharge Plan or Barriers: Patient will return home to follow up with outpatient services.  Reason for Continuation of Hospitalization:  Depression Anxiety Medication Stabilization   Comments: N/A  Estimated length of stay: 1-2 days    Patient is a 25 year old female with a diagnosis of Bipolar Disorder. Patient presented to the hospital after taking too many pills. Patient denies her intention was suicide but knew she needed help right after taking the pills. Patient reports primary triggers for admission is failing at school, work is stressful and a recent break up. Patient will benefit from crisis stabilization, medication evaluation, group therapy and psycho education in addition to case management for discharge planning. At discharge, it is recommended that patient remain compliant with established discharge plan and continued treatment.   Review of initial/current patient goals per problem list:  1. Goal(s): Patient will participate in aftercare plan   Met: Yes   Target date: 3-5 days post admission date   As evidenced by: Patient will participate within aftercare plan AEB aftercare provider and housing plan at discharge being identified.  2/21: Goal met. Patient plans to return home to follow up with outpatient services.    2. Goal (s): Patient will exhibit decreased depressive  symptoms and suicidal ideations.   Met: Yes   Target date: 3-5 days post admission date   As evidenced by: Patient will utilize self rating of depression at 3 or below and demonstrate decreased signs of depression or be deemed stable for discharge by MD.  2/20: Goal met. Patient rates depression 2-3, denies SI.    3. Goal(s): Patient will demonstrate decreased signs and symptoms of anxiety.   Met: Yes   Target date: 3-5 days post admission date   As evidenced by: Patient will utilize self rating of anxiety at 3 or below and demonstrated decreased signs of anxiety, or be deemed stable for discharge by MD  2/21: Goal met. Patient rates anxiety at 3.    Attendees: Patient:    Family:    Physician: Dr. Parke Poisson; Dr. Sabra Heck 04/29/2015 9:30 AM  Nursing: Mayra Neer, Loletta Specter, [address] [place], [place] 04/29/2015 9:30 AM  Clinical Social Worker: Tilden Fossa, LCSW 04/29/2015 9:30 AM  Other: Peri Maris, LCSWA; [place], LCSW  04/29/2015 9:30 AM  Other:  04/29/2015 9:30 AM  Other: Lars Pinks, Case Manager 04/29/2015 9:30 AM  Other: Agustina Caroli, NP 04/29/2015 9:30 AM  Other:    Other:      Scribe for Treatment Team:  Tilden Fossa, [place]

## 2015-04-29 NOTE — B[facility] Suicide Risk Assessment (Signed)
[facility] INPATIENT:  Family/Significant Other Suicide Prevention Education  Suicide Prevention Education:  Education Completed; mother Vinson Moselle [phone],  (name of family member/significant other) has been identified by the patient as the family member/significant other with whom the patient will be residing, and identified as the person(s) who will aid the patient in the event of a mental health crisis (suicidal ideations/suicide attempt).  With written consent from the patient, the family member/significant other has been provided the following suicide prevention education, prior to the and/or following the discharge of the patient.  The suicide prevention education provided includes the following:  Suicide risk factors  Suicide prevention and interventions  National Suicide Hotline telephone number  [facility] assessment telephone number  [facility] Emergency Assistance 911  [facility] and/or Residential Mobile Crisis Unit telephone number  Request made of family/significant other to:  Remove weapons (e.g., guns, rifles, knives), all items previously/currently identified as safety concern.    Remove drugs/medications (over-the-counter, prescriptions, illicit drugs), all items previously/currently identified as a safety concern.  The family member/significant other verbalizes understanding of the suicide prevention education information provided.  The family member/significant other agrees to remove the items of safety concern listed above.  Tannia Contino, [place] 04/29/2015, 4:21 PM

## 2015-04-29 NOTE — Progress Notes (Signed)
Recreation Therapy Notes  Animal-Assisted Activity (AAA) Program Checklist/Progress Notes Patient Eligibility Criteria Checklist & Daily Group note for Rec Tx Intervention  Date: 02.21.2017 Time: 2:45pm Location: [address] Day[address]   AAA/T Program Assumption of Risk Form signed by Patient/ or Parent Legal Guardian yes  Patient is free of allergies or sever asthma yes  Patient reports no fear of animals yes  Patient reports no history of cruelty to animals yes  Patient understands his/her participation is voluntary yes  Behavioral Response: Did not attend.   Kyrie Fludd L Odile Veloso, LRT/CTRS  Caisley Baxendale L 04/29/2015 3:15 PM 

## 2015-04-29 NOTE — Progress Notes (Signed)
Discharge note: Pt received both written and verbal discharge instructions. Pt verbalized understanding of discharge instructions. Pt agreed to f/u appt and med regimen. Pt received SRA, Trans report, AVS and prescriptions. Pt received letter for school that was provided by CSW.  Pt safely discharged to the lobby and picked up by her mother.

## 2015-04-29 NOTE — Progress Notes (Signed)
  [facility] Adult Case Management Discharge Plan :  Will you be returning to the same living situation after discharge:  Yes,  patient will return home At discharge, do you have transportation home?: Yes,  mother will transport Do you have the ability to pay for your medications: Yes,  patient will be provided with prescriptions at discharge  Release of information consent forms completed and in the chart;  Patient's signature needed at discharge.  Patient to Follow up at: Follow-up Information    Follow up with [facility] the [place] On 05/02/2015.   Why:  Therapy appointment on Friday January 24th at 11am with Roxine Caddy. Call office if you need to reschedule.    Contact information:   [address] [place] [address] Phone: [phone] Fax: [phone]      Follow up with [facility].   Why:  social worker will call you with appointment information for crisis management meeting with Genia Del and for upcoming medication management appointment.   Contact information:   [address] [place], [place] [address] [phone]      Next level of care provider has access to [facility] Link:no  Safety Planning and Suicide Prevention discussed: Yes,  with mother  Have you used any form of tobacco in the last 30 days? (Cigarettes, Smokeless Tobacco, Cigars, and/or Pipes): No  Has patient been referred to the Quitline?: N/A patient is not a smoker  Patient has been referred for addiction treatment: N/A  Jazzmine Kleiman, [place] 04/29/2015, 4:22 PM

## 2015-04-29 NOTE — Discharge Summary (Signed)
Physician Discharge Summary Note  Patient:  Janice Rowland is an 25 y.o., female MRN:  [id number] DOB:  1990-03-13 Patient phone:  [phone] (home)  Patient address:   [address] [place] [address],  Total Time spent with patient: 30 minutes  Date of Admission:  04/27/2015 Date of Discharge: 04/29/2015  Reason for Admission:  Suicide attempt by overdose  Principal Problem: Bipolar 1 disorder [facility]) Discharge Diagnoses: Patient Active Problem List   Diagnosis Date Noted  . Bipolar 1 disorder (HCC) [F31.9] 04/27/2015    Priority: High  . Opiate use [F11.90]   . Temporal bone fracture (HCC) [S02.19XA] 12/30/2014  . Traumatic dislocation of ossicle of right ear [H74.21] 12/30/2014  . Fall [W19.XXXA] 12/27/2014  . TMJ dislocation [S03.0XXA] 12/27/2014  . Scalp laceration [S01.01XA] 12/27/2014  . Concussion [S06.0X9A] 12/27/2014  . Ankle fracture, left [S82.892A] 12/27/2014  . Multiple abrasions [T14.8] 12/27/2014  . Fracture of multiple ribs [S22.49XA] 12/26/2014    Past Psychiatric History:  Bipolar mood disorder  Past Medical History:  Past Medical History  Diagnosis Date  . Bipolar 1 disorder (HCC)    History reviewed. No pertinent past surgical history. Family History: History reviewed. No pertinent family history. Family Psychiatric  History:  Denied Social History:  History  Alcohol Use No    Comment: "occasional"     History  Drug Use No    Comment: denies    Social History   Social History  . Marital Status: Single    Spouse Name: N/A  . Number of Children: N/A  . Years of Education: N/A   Social History Main Topics  . Smoking status: Never Smoker   . Smokeless tobacco: Never Used  . Alcohol Use: No     Comment: "occasional"  . Drug Use: No     Comment: denies  . Sexual Activity: Not Asked   Other Topics Concern  . None   Social History Narrative    Hospital Course:  Janice Rowland is an 25 y.o. female who [facility]  voluntarily after being "really depressed and angry" and took 10 oxycodone around 4PM with the intent to kill herself.  Janice Rowland was admitted for Bipolar 1 disorder [facility]) and crisis management.  She was treated with the following medications as listed below.  Janice Rowland was discharged with current medication and was instructed on how to take medications as prescribed; (details listed below under Medication List).  Medical problems were identified and treated as needed.  Home medications were restarted as appropriate.  Improvement was monitored by observation and Janice Rowland daily report of symptom reduction.  Emotional and mental status was monitored by daily self-inventory reports completed by Janice Rowland and clinical staff.         Janice Rowland was evaluated by the treatment team for stability and plans for continued recovery upon discharge.  Janice Rowland motivation was an integral factor for scheduling further treatment.  Employment, transportation, bed availability, health status, family support, and any pending legal issues were also considered during her hospital stay.  He was offered further treatment options upon discharge including but not limited to Residential, Intensive Outpatient, and Outpatient treatment.  Janice Rowland will follow up with the services as listed below under Follow Up Information.     Upon completion of this admission the Janice Rowland was both mentally and medically stable for discharge denying suicidal/homicidal ideation, auditory/visual/tactile hallucinations, delusional thoughts and paranoia.     Physical Findings: AIMS: Facial and  Oral Movements Muscles of Facial Expression: None, normal Lips and Perioral Area: None, normal Jaw: None, normal Tongue: None, normal,Extremity Movements Upper (arms, wrists, hands, fingers): None, normal Lower (legs, knees, ankles, toes): None, normal, Trunk Movements Neck, shoulders, hips: None, normal, Overall  Severity Severity of abnormal movements (highest score from questions above): None, normal Incapacitation due to abnormal movements: None, normal Patient's awareness of abnormal movements (rate only patient's report): No Awareness, Dental Status Current problems with teeth and/or dentures?: No Does patient usually wear dentures?: No  CIWA:  CIWA-Ar Total: 2 COWS:  COWS Total Score: 0  Musculoskeletal: Strength & Muscle Tone: within normal limits Gait & Station: normal Patient leans: N/A  Psychiatric Specialty Exam:  SEE MD SRA  Review of Systems  All other systems reviewed and are negative.   Blood pressure 98/63, pulse 98, temperature 98.6 F (37 C), temperature source Oral, resp. rate 16, height 5' (1.524 m), weight 50.349 kg (111 lb), SpO2 100 %.Body mass index is 21.68 kg/(m^2).  Have you used any form of tobacco in the last 30 days? (Cigarettes, Smokeless Tobacco, Cigars, and/or Pipes): No  Has this patient used any form of tobacco in the last 30 days? (Cigarettes, Smokeless Tobacco, Cigars, and/or Pipes) Yes, N/A  Blood Alcohol level:  Lab Results  Component Value Date   ETH <5 04/26/2015   ETH <5 12/26/2014    Metabolic Disorder Labs:  No results found for: HGBA1C, MPG No results found for: PROLACTIN No results found for: CHOL, TRIG, HDL, CHOLHDL, VLDL, LDLCALC  See Psychiatric Specialty Exam and Suicide Risk Assessment completed by Attending Physician prior to discharge.  Discharge destination:  Home  Is patient on multiple antipsychotic therapies at discharge:  No   Has Patient had three or more failed trials of antipsychotic monotherapy by history:  No  Recommended Plan for Multiple Antipsychotic Therapies: NA     Medication List    STOP taking these medications        GNP GINGKO BILOBA EXTRACT PO     LamoTRIgine 300 MG Tb24  Replaced by:  lamoTRIgine 150 MG tablet     MAGNESIUM PO     medroxyPROGESTERone 150 MG/ML injection  Commonly known as:   DEPO-PROVERA     ondansetron 4 MG tablet  Commonly known as:  ZOFRAN     oxyCODONE-acetaminophen 10-325 MG tablet  Commonly known as:  PERCOCET     VIACTIV 500-500-40 MG-UNT-MCG Chew  Generic drug:  Calcium-Vitamin D-Vitamin K      TAKE these medications      Indication   busPIRone 10 MG tablet  Commonly known as:  BUSPAR  Take 1 tablet (10 mg total) by mouth 3 (three) times daily.   Indication:  Generalized Anxiety Disorder     hydrOXYzine 10 MG tablet  Commonly known as:  ATARAX/VISTARIL  Take 1 tablet (10 mg total) by mouth 3 (three) times daily as needed for anxiety.   Indication:  Anxiety Neurosis     lamoTRIgine 150 MG tablet  Commonly known as:  LAMICTAL  Take 1 tablet (150 mg total) by mouth 2 (two) times daily.   Indication:  mood stabilization     traZODone 50 MG tablet  Commonly known as:  DESYREL  Take 1 tablet (50 mg total) by mouth at bedtime.   Indication:  Aggressive Behavior           Follow-up Information    Follow up with [facility] of the [place] On 05/02/2015.   Why:  Therapy appointment on Friday January 24th at 11am with Roxine Caddy. Call office if you need to reschedule.    Contact information:   [address], [place] [address] Phone: [phone] Fax: [phone]      Follow up with [facility].   Why:  social worker will call you with appointment information for crisis management meeting with Janice Del and for upcoming medication management appointment.   Contact information:   [address] [place], [place] [address] [phone]      Follow-up recommendations:  Activity:  as tol Diet:  as tol  Comments:  1.  Take all your medications as prescribed.              2.  Report any adverse side effects to outpatient provider.                       3.  Patient instructed to not use alcohol or illegal drugs while on prescription medicines.            4.  In the event of worsening symptoms, instructed patient  to call 911, the crisis hotline or go to nearest emergency room for evaluation of symptoms.  Signed: Lindwood Qua, NP [facility] 04/29/2015, 2:34 PM  Patient seen, Suicide Assessment Completed.  Disposition Plan Reviewed

## 2015-04-29 NOTE — [facility] Group Notes (Signed)
[facility] LCSW Group Therapy 04/29/2015 1:15 PM Type of Therapy: Group Therapy Participation Level: Active  Participation Quality: Attentive, Sharing and Supportive  Affect: Appropriate  Cognitive: Alert and Oriented  Insight: Developing/Improving and Engaged  Engagement in Therapy: Developing/Improving and Engaged  Modes of Intervention: Activity, Clarification, Confrontation, Discussion, Education, Exploration, Limit-setting, Orientation, Problem-solving, Rapport Building, Reality Testing, Socialization and Support  Summary of Progress/Problems: Patient was attentive and engaged with speaker from [employer]. Patient was attentive to speaker while they shared their story of dealing with mental health and overcoming it. Patient expressed interest in their programs and services and received information on their agency. Patient processed ways they can relate to the speaker.   Dawnell Bryant, LCSW Clinical Social Worker [facility] [phone]   

## 2015-04-30 LAB — LAMOTRIGINE LEVEL: Lamotrigine Lvl: 6.8 ug/mL (ref 2.0–20.0)

## 2015-06-08 ENCOUNTER — Emergency Department (HOSPITAL_COMMUNITY)
Admission: EM | Admit: 2015-06-08 | Discharge: 2015-06-08 | Disposition: A | Discharge status: Home / Self Care | Payer: BLUE CROSS/BLUE SHIELD | Attending: Emergency Medicine | Admitting: Emergency Medicine

## 2015-06-08 ENCOUNTER — Encounter (HOSPITAL_COMMUNITY): Payer: Self-pay | Admitting: Emergency Medicine

## 2015-06-08 DIAGNOSIS — F319 Bipolar disorder, unspecified: Secondary | ICD-10-CM | POA: Diagnosis not present

## 2015-06-08 DIAGNOSIS — Z3202 Encounter for pregnancy test, result negative: Secondary | ICD-10-CM | POA: Insufficient documentation

## 2015-06-08 DIAGNOSIS — F419 Anxiety disorder, unspecified: Secondary | ICD-10-CM | POA: Insufficient documentation

## 2015-06-08 DIAGNOSIS — F3160 Bipolar disorder, current episode mixed, unspecified: Secondary | ICD-10-CM | POA: Insufficient documentation

## 2015-06-08 DIAGNOSIS — Z79899 Other long term (current) drug therapy: Secondary | ICD-10-CM | POA: Insufficient documentation

## 2015-06-08 DIAGNOSIS — F309 Manic episode, unspecified: Secondary | ICD-10-CM | POA: Diagnosis present

## 2015-06-08 LAB — COMPREHENSIVE METABOLIC PANEL
ALBUMIN: 4.5 g/dL (ref 3.5–5.0)
ALT: 20 U/L (ref 14–54)
AST: 21 U/L (ref 15–41)
Alkaline Phosphatase: 82 U/L (ref 38–126)
Anion gap: 7 (ref 5–15)
BILIRUBIN TOTAL: 0.3 mg/dL (ref 0.3–1.2)
BUN: 12 mg/dL (ref 6–20)
CHLORIDE: 108 mmol/L (ref 101–111)
CO2: 25 mmol/L (ref 22–32)
CREATININE: 0.81 mg/dL (ref 0.44–1.00)
Calcium: 9.2 mg/dL (ref 8.9–10.3)
GFR calc Af Amer: >60 mL/min (ref >60)
GFR calc non Af Amer: >60 mL/min (ref >60)
GLUCOSE: 103 mg/dL — AB (ref 65–99)
POTASSIUM: 3.6 mmol/L (ref 3.5–5.1)
Sodium: 140 mmol/L (ref 135–145)
TOTAL PROTEIN: 6.6 g/dL (ref 6.5–8.1)

## 2015-06-08 LAB — RAPID URINE DRUG SCREEN, HOSP PERFORMED
Amphetamines: NOT DETECTED
BENZODIAZEPINES: NOT DETECTED
Barbiturates: NOT DETECTED
COCAINE: NOT DETECTED
Opiates: NOT DETECTED
Tetrahydrocannabinol: NOT DETECTED

## 2015-06-08 LAB — ACETAMINOPHEN LEVEL

## 2015-06-08 LAB — CBC
HCT: 41.2 % (ref 36.0–46.0)
Hemoglobin: 14.5 g/dL (ref 12.0–15.0)
MCH: 31 pg (ref 26.0–34.0)
MCHC: 35.2 g/dL (ref 30.0–36.0)
MCV: 88 fL (ref 78.0–100.0)
PLATELETS: 339 10*3/uL (ref 150–400)
RBC: 4.68 MIL/uL (ref 3.87–5.11)
RDW: 12.6 % (ref 11.5–15.5)
WBC: 11.9 10*3/uL — AB (ref 4.0–10.5)

## 2015-06-08 LAB — ETHANOL

## 2015-06-08 LAB — SALICYLATE LEVEL

## 2015-06-08 LAB — POC URINE PREG, ED: PREG TEST UR: NEGATIVE

## 2015-06-08 MED ORDER — TRAZODONE HCL 50 MG PO TABS: 50.0000 mg | ORAL_TABLET | Freq: Every day | ORAL | Status: DC

## 2015-06-08 MED ORDER — ONDANSETRON HCL 4 MG PO TABS: 4.0000 mg | ORAL_TABLET | Freq: Three times a day (TID) | ORAL | Status: DC | PRN

## 2015-06-08 MED ORDER — IBUPROFEN 200 MG PO TABS: 600.0000 mg | ORAL_TABLET | Freq: Three times a day (TID) | ORAL | Status: DC | PRN

## 2015-06-08 MED ORDER — HYDROXYZINE HCL 10 MG PO TABS
10.0000 mg | ORAL_TABLET | Freq: Three times a day (TID) | ORAL | Status: DC | PRN
  Filled 2015-06-08: qty 1

## 2015-06-08 MED ORDER — BUSPIRONE HCL 10 MG PO TABS
10.0000 mg | ORAL_TABLET | Freq: Three times a day (TID) | ORAL | Status: DC
  Administered 2015-06-08: 10 mg via ORAL
  Filled 2015-06-08: qty 1

## 2015-06-08 MED ORDER — LAMOTRIGINE 150 MG PO TABS
150.0000 mg | ORAL_TABLET | Freq: Two times a day (BID) | ORAL | Status: DC
  Administered 2015-06-08: 150 mg via ORAL
  Filled 2015-06-08 (×2): qty 1

## 2015-06-08 NOTE — ED Notes (Signed)
On the phone 
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
## 2015-06-08 NOTE — ED Notes (Signed)
Up to the bathroom 

## 2015-06-08 NOTE — ED Notes (Signed)
Up tot he bathroom to shower and change scrubs 

## 2015-06-08 NOTE — ED Notes (Signed)
tts in w/ pt 

## 2015-06-08 NOTE — [facility] Suicide Risk Assessment (Signed)
Suicide Risk Assessment  Discharge Assessment   [facility]   Principal Problem: Bipolar 1 disorder [facility](HCC) Discharge Diagnoses:  Patient Active Problem List   Diagnosis Date Noted  . Bipolar affective disorder, current episode mixed (HCC) [F31.60]   . Bipolar 1 disorder (HCC) [F31.9] 04/27/2015  . Opiate use [F11.90]   . Temporal bone fracture (HCC) [S02.19XA] 12/30/2014  . Traumatic dislocation of ossicle of right ear [H74.21] 12/30/2014  . Fall [W19.XXXA] 12/27/2014  . TMJ dislocation [S03.0XXA] 12/27/2014  . Scalp laceration [S01.01XA] 12/27/2014  . Concussion [S06.0X9A] 12/27/2014  . Ankle fracture, left [S82.892A] 12/27/2014  . Multiple abrasions [T14.8] 12/27/2014  . Fracture of multiple ribs [S22.49XA] 12/26/2014    Total Time spent with patient: 20 minutes  Musculoskeletal: Strength & Muscle Tone: within normal limits Gait & Station: normal Patient leans: N/A  Psychiatric Specialty Exam:   Blood pressure 105/73, pulse 118, temperature 98 F (36.7 C), temperature source Oral, resp. rate 16, SpO2 100 %.There is no weight on file to calculate BMI.  General Appearance: Casual and Fairly Groomed  Patent attorneyye Contact:: Good  Speech: Clear and Coherent and Normal Rate  Volume: Normal  Mood: Angry and Anxious  Affect: Congruent  Thought Process: Coherent, Goal Directed and Intact  Orientation: Full (Time, Place, and Person)  Thought Content: WDL  Suicidal Thoughts: No  Homicidal Thoughts: No  Memory: Immediate; Good Recent; Good Remote; Good  Judgement: Good  Insight: Good  Psychomotor Activity: Normal  Concentration: Good  Recall: NA  Fund of Knowledge:Good  Language: Good  Akathisia: NA  Handed: Right  AIMS (if indicated):    Assets: Desire for Improvement  ADL's: Intact  Cognition: WNL         Mental Status Per Nursing Assessment::   On Admission:     Demographic Factors:   Adolescent or young adult, Caucasian, Low socioeconomic status and Living alone  Loss Factors: NA  Historical Factors: NA  Risk Reduction Factors:   NA and states she love her mother who is supportive.  Continued Clinical Symptoms:  Bipolar Disorder:   Depressive phase  Cognitive Features That Contribute To Risk:  Polarized thinking    Suicide Risk:  Minimal: No identifiable suicidal ideation.  Patients presenting with no risk factors but with morbid ruminations; may be classified as minimal risk based on the severity of the depressive symptoms    Plan Of Care/Follow-up recommendations:  Activity:  as tolerated Diet:  regular  Earney NavyJosephine C Onuoha, NP    PMHNP-BC 06/08/2015, 1:27 PM   Patient seen and I agree with assessment  Diannia Rudereborah Ross M.D.

## 2015-06-08 NOTE — ED Provider Notes (Signed)
CSN: [id number]     Arrival date & time 06/08/15  0446 History   First MD Initiated Contact with Patient 06/08/15 [phone]     Chief Complaint  Patient presents with  . Manic Behavior     Patient is a 25 y.o. female presenting with mental health disorder. The history is provided by the patient.  Mental Health Problem Presenting symptoms: aggressive behavior   Degree of incapacity (severity):  Moderate Onset quality:  Gradual Timing:  Constant Progression:  Worsening Chronicity:  New Relieved by:  Nothing Worsened by:  Nothing tried Associated symptoms: anxiety   Associated symptoms: no abdominal pain and no headaches    Pt with h/o bipolar disorder who presents under IVC Mother took out IVC as she is concerned pt was acting aggressive and may harm herself Pt denies SI She has no other complaints  Past Medical History  Diagnosis Date  . Bipolar 1 disorder (HCC)    History reviewed. No pertinent past surgical history. No family history on file. Social History  Substance Use Topics  . Smoking status: Never Smoker   . Smokeless tobacco: Never Used  . Alcohol Use: No     Comment: "occasional"   OB History    No data available     Review of Systems  Constitutional: Negative for fever.  Gastrointestinal: Negative for vomiting and abdominal pain.  Neurological: Negative for headaches.  Psychiatric/Behavioral: The patient is nervous/anxious.   All other systems reviewed and are negative.     Allergies  Sulfa antibiotics  Home Medications   Prior to Admission medications   Medication Sig Start Date End Date Taking? Authorizing Provider  busPIRone (BUSPAR) 10 MG tablet Take 1 tablet (10 mg total) by mouth 3 (three) times daily. 04/29/15   Adonis Brook, NP  hydrOXYzine (ATARAX/VISTARIL) 10 MG tablet Take 1 tablet (10 mg total) by mouth 3 (three) times daily as needed for anxiety. 04/29/15   Adonis Brook, NP  lamoTRIgine (LAMICTAL) 150 MG tablet Take 1 tablet (150 mg  total) by mouth 2 (two) times daily. 04/29/15   Adonis Brook, NP  traZODone (DESYREL) 50 MG tablet Take 1 tablet (50 mg total) by mouth at bedtime. 04/29/15   Adonis Brook, NP   BP 122/81 mmHg  Pulse 89  Temp(Src) 98 F (36.7 C) (Oral)  Resp 16  SpO2 100% Physical Exam CONSTITUTIONAL: Well developed/well nourished, anxious HEAD: Normocephalic/atraumatic EYES: EOMI ENMT: Mucous membranes moist NECK: supple no meningeal signs CV: S1/S2 noted, no murmurs/rubs/gallops noted LUNGS: Lungs are clear to auscultation bilaterally, no apparent distress ABDOMEN: soft, nontender NEURO: Pt is awake/alert/appropriate, moves all extremitiesx4.  No facial droop.   EXTREMITIES: pulses normal/equal, full ROM SKIN: warm, color normal PSYCH: anxious  ED Course  Procedures   Pt here under IVC  I have completed first exam She will need psych evaluation Pt is medically stable at this time  Labs Review Labs Reviewed  COMPREHENSIVE METABOLIC PANEL - Abnormal; Notable for the following:    Glucose, Bld 103 (*)    All other components within normal limits  ACETAMINOPHEN LEVEL - Abnormal; Notable for the following:    Acetaminophen (Tylenol), Serum <10 (*)    All other components within normal limits  CBC - Abnormal; Notable for the following:    WBC 11.9 (*)    All other components within normal limits  ETHANOL  SALICYLATE LEVEL  URINE RAPID DRUG SCREEN, HOSP PERFORMED  POC URINE PREG, ED    I have personally reviewed and  evaluated these lab results as part of my medical decision-making.   MDM   Final diagnoses:  Bipolar affective disorder, current episode mixed, current episode severity unspecified [facility](HCC)    Nursing notes including past medical history and social history reviewed and considered in documentation Labs/vital reviewed myself and considered during evaluation     Zadie Rhineonald Raunel Dimartino, MD 06/08/15 [phone]

## 2015-06-08 NOTE — Clinical Social Work Note (Signed)
CSW provided psychiatry with requested paperwork to rescind IVC status.  Elray Buba.Ireta Pullman, LCSW [facility] Clinical Social Worker - Weekend Coverage cell #: [phone]

## 2015-06-08 NOTE — ED Notes (Signed)
Pt. Transferred to [facility] from ED to room. Report from Autumn RN. Pt. Oriented to unit including Q15 minute rounds as well as the security cameras for their protection. Patient is alert and oriented, warm and dry in no acute distress. Patient denies SI, HI, and AVH. Pt. Encouraged to let me know if needs arise.

## 2015-06-08 NOTE — Clinical Social Work Note (Signed)
CSW called and spoke with pt's mother per the request of psychiatry.  Pt's mother stated "I was scared" when pt told her that she was in emotional pain and she was "scared" that she would commit suicide so she took out IVC papers. Pt's mother reported that she thinks she "overreacted" because "I was scared".  Pt's mother reported that pt was displaying anger and so she "was scared" .  Pt's mother reported that pt "never said she was going to commit suicide" and she sees a DBT therapist weekly that started 4 weeks ago.  Pt also has an exam tomorrow and is anxious about missing it.  Elray Buba.Veola Cafaro, LCSW [facility] Clinical Social Worker - Weekend Coverage cell #: [phone]

## 2015-06-08 NOTE — ED Notes (Signed)
tts into see 

## 2015-06-08 NOTE — ED Notes (Signed)
Pt requested we give mother her apartment key. Pts mother Janice Rowland did pick up key and she request psychiatry please call and speak with her regarding pts care plan

## 2015-06-08 NOTE — ED Notes (Addendum)
Written dc instructions reviewed with patient.  Pt encouraged to take her medications as directed, and continue to work with her OP therapists.  Pt encouraged to seek treatment if she has suicidal thoughts.  Pt verbalized understanding and agreed to do so.  Pt ambulatory to dc area with mHt, belongings returned after leaving the area.  Pt's mother is on the way to pick her up.

## 2015-06-08 NOTE — ED Notes (Signed)
Bed: WTR8 Expected date:  Expected time:  Means of arrival:  Comments: 
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
## 2015-06-08 NOTE — ED Notes (Addendum)
Pt transported to ED by [employer] under IVC by Mother Theodosia Paling(Jacqueline Berry). Per IVC pt has had erratic behavior, leaving for extended periods, anger outburst. Mother states she feared for her own safety and concerned pt would attempt suicide again(attempt 02/28) Pt admits she did not behave appropriately earlier tonight towards her mother, pt rambling about father, step father and feeling of abandonment that were exacerbated by break up with boyfriend. Pt states she is compliant with Lamictal however bottle she brought with her is full from 2/21.
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
## 2017-07-20 ENCOUNTER — Telehealth: Payer: Self-pay | Admitting: Obstetrics and Gynecology

## 2017-07-20 NOTE — Telephone Encounter (Signed)
Called and left a message for patient to call back to schedule a new patient doctor referral appointment with our office for a consultation re: abnormal pap.

## 2017-07-25 ENCOUNTER — Encounter: Payer: Self-pay | Admitting: Obstetrics and Gynecology

## 2017-07-25 ENCOUNTER — Other Ambulatory Visit: Payer: Self-pay

## 2017-07-25 ENCOUNTER — Ambulatory Visit: Payer: BLUE CROSS/BLUE SHIELD | Admitting: Obstetrics and Gynecology

## 2017-07-25 VITALS — BP 92/60 | HR 76 | Resp 14 | Ht 60.5 in | Wt 118.0 lb

## 2017-07-25 DIAGNOSIS — R87610 Atypical squamous cells of undetermined significance on cytologic smear of cervix (ASC-US): Secondary | ICD-10-CM

## 2017-07-25 NOTE — Patient Instructions (Signed)
Colposcopy  Colposcopy is a procedure to examine the lowest part of the uterus (cervix), the vagina, and the area around the vaginal opening (vulva) for abnormalities or signs of disease. The procedure is done using a lighted microscope or magnifying lens (colposcope). If any unusual cells are found during the procedure, your health care provider may remove a tissue sample for testing (biopsy). A colposcopy may be done if you:   Have an abnormal Pap test. A Pap test is a screening test that is used to check for signs of cancer or infection of the vagina, cervix, and uterus.   Have a Pap smear test in which you test positive for high-risk HPV (human papillomavirus).   Have a sore or lesion on your cervix.   Have genital warts on your vulva, vagina, or cervix.   Took certain medicines while pregnant, such as diethylstilbestrol (DES).   Have pain during sexual intercourse.   Have vaginal bleeding, especially after sexual intercourse.   Need to have a cervical polyp removed.   Need to have a lost intrauterine device (IUD) string located.    Let your health care provider know about:   Any allergies you have, including allergies to prescribed medicine, latex, or iodine.   All medicines you are taking, including vitamins, herbs, eye drops, creams, and over-the-counter medicines. Bring a list of all of your medicines to your appointment.   Any problems you or family members have had with anesthetic medicines.   Any blood disorders you have.   Any surgeries you have had.   Any medical conditions you have, such as pelvic inflammatory disease (PID) or endometrial disorder.   Any history of frequent fainting.   Your menstrual cycle and what form of birth control (contraception) you use.   Your medical history, including any prior cervical treatment.   Whether you are pregnant or may be pregnant.  What are the risks?  Generally, this is a safe procedure. However, problems may occur,  including:   Pain.   Infection, which may include a fever, bad-smelling discharge, or pelvic pain.   Bleeding or discharge.   Misdiagnosis.   Fainting and vasovagal reactions, but this is rare.   Allergic reactions to medicines.   Damage to other structures or organs.    What happens before the procedure?   If you have your menstrual period or will have it at the time of your procedure, tell your health care provider. A colposcopy typically is not done during menstruation.   Continue your contraceptive practices before and after the procedure.   For 24 hours before the colposcopy:  ? Do not douche.  ? Do not use tampons.  ? Do not use medicines, creams, or suppositories in the vagina.  ? Do not have sexual intercourse.   Ask your health care provider about:  ? Changing or stopping your regular medicines. This is especially important if you are taking diabetes medicines or blood thinners.  ? Taking medicines such as aspirin and ibuprofen. These medicines can thin your blood. Do not take these medicines before your procedure if your health care provider instructs you not to. It is likely that your health care provider will tell you to avoid taking aspirin or medicine that contains aspirin for 7 days before the procedure.   Follow instructions from your health care provider about eating or drinking restrictions. You will likely need to eat a regular diet the day of the procedure and not skip any meals.   You   may have an exam or testing. A pregnancy test will be taken on the day of the procedure.   You may have a blood or urine sample taken.   Plan to have someone take you home from the hospital or clinic.   If you will be going home right after the procedure, plan to have someone with you for 24 hours.  What happens during the procedure?   You will lie down on your back, with your feet in foot rests (stirrups).   A warmed and lubricated instrument (speculum) will be inserted into your vagina. The  speculum will be used to hold apart the walls of your vagina so your health care provider can see your cervix and the inside of your vagina.   A cotton swab will be used to place a small amount of liquid solution on the areas to be examined. This solution makes it easier to see abnormal cells. You may feel a slight burning during this part.   The colposcope will be used to scan the cervix with a bright white light. The colposcope will be held near your vulvaand will magnify your vulva, vagina, and cervix for easier examination.   Your health care provider may decide to take a biopsy. If so:  ? You may be given medicine to numb the area (local anesthetic).  ? Surgical instruments will be used to suck out mucus and cells through your vagina.  ? You may feel mild pain while the tissue sample is removed.  ? Bleeding may occur. A solution may be used to stop the bleeding.  ? If a sample of tissue is needed from the inside of the cervix, a different procedure called endocervical curettage (ECC) may be completed. During this procedure, a curved instrument (curette) will be used to scrape cells from your cervix or the top of your cervix (endocervix).   Your health care provider will record the location of any abnormalities.  The procedure may vary among health care providers and hospitals.  What happens after the procedure?   You will lie down and rest for a few minutes. You may be offered juice or cookies.   Your blood pressure, heart rate, breathing rate, and blood oxygen level will be monitored until any medicines you were given have worn off.   You may have to wear compression stockings. These stockings help to prevent blood clots and reduce swelling in your legs.   You may have some cramping in your abdomen. This should go away after a few minutes.  This information is not intended to replace advice given to you by your health care provider. Make sure you discuss any questions you have with your health care  provider.  Document Released: 05/15/2002 Document Revised: 10/21/2015 Document Reviewed: 09/29/2015  Elsevier Interactive Patient Education  2018 [employer] Inc[employer]

## 2017-07-25 NOTE — Progress Notes (Signed)
27 y.o. [id number] SingleCaucasianF here for a consultation from Suann Larry, ANP-BC for abnormal pap smears.    10/15 pap ASCUS, +HPV, 12/16 ASCUS, no hpv, 4/18 normal pap, Pap from 07/11/17 ASCUS Period Cycle (Days): 28 Period Duration (Days): 4 days  Period Pattern: Regular Menstrual Flow: Light Menstrual Control: Thin pad, Tampon Menstrual Control Change Freq (Hours): changes pad 3-4 times a day  Dysmenorrhea: (!) Mild Dysmenorrhea Symptoms: Cramping  Patient's last menstrual period was 06/27/2017.          Sexually active: Yes.    The current method of family planning is none (just stopped OCP last week).    Exercising: Yes.    walking/running/ yoga  Smoker:  Former smoker  Health Maintenance: Pap:  07-11-17 ASCUS  History of abnormal Pap:  yes TDaP:  unsure Gardasil: completed all 3    reports that she has been smoking cigarettes.  She has smoked for the past 0.50 years. She has never used smokeless tobacco. She reports that she drinks about 0.6 oz of alcohol per week. She reports that she does not use drugs. Goes to [employer], She will graduate in January.   Past Medical History:  Diagnosis Date  . Anxiety   . Bipolar 1 disorder ([facility])   . Depression   . Dysmenorrhea    She has had issues with her depression and anxiety. Has gone off all of her meds and is going to restart Lamictal. She has a therapist now and is going to see a Psychiatrist on 08/08/17  History reviewed. No pertinent surgical history.  Current Outpatient Medications  Medication Sig Dispense Refill  . busPIRone (BUSPAR) 10 MG tablet Take 1 tablet (10 mg total) by mouth 3 (three) times daily. 90 tablet 0  . Calcium-Vitamin D-Vitamin K (VIACTIV) 500-500-40 MG-UNT-MCG CHEW Chew 1 tablet by mouth every morning.    . hydrOXYzine (ATARAX/VISTARIL) 10 MG tablet Take 1 tablet (10 mg total) by mouth 3 (three) times daily as needed for anxiety. 30 tablet 0  . lamoTRIgine (LAMICTAL) 200 MG tablet Take 200 mg by mouth every  morning.     No current facility-administered medications for this visit.     Family History  Problem Relation Age of Onset  . Thyroid disease Mother   . Osteoporosis Mother   . Osteoporosis Maternal Aunt   . Thyroid cancer Maternal Aunt   . Breast cancer Maternal Grandmother   . Diabetes Maternal Grandmother   . Arthritis Maternal Grandmother     Review of Systems  Constitutional: Negative.   HENT: Negative.   Eyes: Negative.   Respiratory: Negative.   Cardiovascular: Negative.   Gastrointestinal: Negative.   Endocrine: Negative.   Genitourinary: Negative.   Musculoskeletal: Negative.   Skin: Negative.   Allergic/Immunologic: Negative.   Neurological: Negative.   Psychiatric/Behavioral: Negative.     Exam:   BP 92/60 (BP Location: Right Arm, Patient Position: Sitting, Cuff Size: Normal)   Pulse 76   Resp 14   Ht 5' 0.5" (1.537 m)   Wt 118 lb (53.5 kg)   LMP 06/27/2017   BMI 22.67 kg/m   Weight change: @ Height:   Height: 5' 0.5" (153.7 cm)  Ht Readings from Last 3 Encounters:  07/25/17 5' 0.5" (1.537 m)  04/27/15 5' (1.524 m)  12/26/14 5' 1.25" (1.556 m)    General appearance: alert, cooperative and appears stated age  A:  ASCUS pap x 3 (first one +HPV, others not checked)  P:   Discussed abnormal pap's,  HPV  Recommend colposcopy, colposcopy procedure reviewed  Discussed need for follow up  All of her questions were answered  ~20 minutes was spent face to face, over 80% in counseling   CC: Suann Larry, NP Note sent

## 2017-07-28 ENCOUNTER — Other Ambulatory Visit: Payer: Self-pay

## 2017-07-28 ENCOUNTER — Encounter: Payer: Self-pay | Admitting: Obstetrics and Gynecology

## 2017-07-28 ENCOUNTER — Ambulatory Visit (INDEPENDENT_AMBULATORY_CARE_PROVIDER_SITE_OTHER): Payer: BLUE CROSS/BLUE SHIELD | Admitting: Obstetrics and Gynecology

## 2017-07-28 VITALS — BP 110/62 | HR 66 | Resp 14 | Wt 120.0 lb

## 2017-07-28 DIAGNOSIS — R87610 Atypical squamous cells of undetermined significance on cytologic smear of cervix (ASC-US): Secondary | ICD-10-CM

## 2017-07-28 DIAGNOSIS — Z01812 Encounter for preprocedural laboratory examination: Secondary | ICD-10-CM | POA: Diagnosis not present

## 2017-07-28 LAB — POCT URINE PREGNANCY: PREG TEST UR: NEGATIVE

## 2017-07-28 NOTE — Patient Instructions (Signed)

## 2017-07-28 NOTE — Progress Notes (Signed)
GYNECOLOGY  VISIT   HPI: 27 y.o.   Single  Caucasian  female   G2P0020 with No LMP recorded.   here for a colposcopy for recurrent ASCUS pap smears (2015, 2016, 2018)  GYNECOLOGIC HISTORY: No LMP recorded. Contraception:none  Menopausal hormone therapy: none         OB History    Gravida  2   Para      Term      Preterm      AB  2   Living        SAB      TAB      Ectopic      Multiple      Live Births                 Patient Active Problem List   Diagnosis Date Noted  . Bipolar affective disorder, current episode mixed (HCC)   . Bipolar 1 disorder (HCC) 04/27/2015  . Opiate use   . Temporal bone fracture (HCC) 12/30/2014  . Traumatic dislocation of ossicle of right ear 12/30/2014  . Fall 12/27/2014  . 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 Scalp laceration 12/27/2014  . Concussion 12/27/2014  . Ankle fracture, left 12/27/2014  . Multiple abrasions 12/27/2014  . Fracture of multiple ribs 12/26/2014    Past Medical History:  Diagnosis Date  . Anxiety   . Bipolar 1 disorder (HCC)   . Depression   . Dysmenorrhea     No past surgical history on file.  Current Outpatient Medications  Medication Sig Dispense Refill  . busPIRone (BUSPAR) 10 MG tablet Take 1 tablet (10 mg total) by mouth 3 (three) times daily. 90 tablet 0  . Calcium-Vitamin D-Vitamin K (VIACTIV) 500-500-40 MG-UNT-MCG CHEW Chew 1 tablet by mouth every morning.    . hydrOXYzine (ATARAX/VISTARIL) 10 MG tablet Take 1 tablet (10 mg total) by mouth 3 (three) times daily as needed for anxiety. 30 tablet 0  . lamoTRIgine (LAMICTAL) 200 MG tablet Take 200 mg by mouth every morning.     No current facility-administered medications for this visit.      ALLERGIES: Sulfa antibiotics  Family History  Problem Relation Age of Onset  . Thyroid disease Mother   . Osteoporosis Mother   . Osteoporosis Maternal Aunt   . Thyroid cancer Maternal Aunt   . Breast cancer Maternal Grandmother   . Diabetes  Maternal Grandmother   . Arthritis Maternal Grandmother     Social History   Socioeconomic History  . Marital status: Single    Spouse name: Not on file  . Number of children: Not on file  . Years of education: Not on file  . Highest education level: Not on file  Occupational History  . Not on file  Social Needs  . Financial resource strain: Not on file  . Food insecurity:    Worry: Not on file    Inability: Not on file  . Transportation needs:    Medical: Not on file    Non-medical: Not on file  Tobacco Use  . Smoking status: Light Tobacco Smoker    Years: 0.50    Types: Cigarettes  . Smokeless tobacco: Never Used  Substance and Sexual Activity  . Alcohol use: Yes    Alcohol/week: 0.6 oz    Types: 1 Standard drinks or equivalent per week  . Drug use: No    Types: Other-see comments    Comment: denies  . Sexual activity: Yes    Partners:  Male  Lifestyle  . Physical activity:    Days per week: Not on file    Minutes per session: Not on file  . Stress: Not on file  Relationships  . Social connections:    Talks on phone: Not on file    Gets together: Not on file    Attends religious service: Not on file    Active member of club or organization: Not on file    Attends meetings of clubs or organizations: Not on file    Relationship status: Not on file  . Intimate partner violence:    Fear of current or ex partner: Not on file    Emotionally abused: Not on file    Physically abused: Not on file    Forced sexual activity: Not on file  Other Topics Concern  . Not on file  Social History Narrative  . Not on file    Review of Systems  Constitutional: Negative.   HENT: Negative.   Eyes: Negative.   Respiratory: Negative.   Cardiovascular: Negative.   Gastrointestinal: Negative.   Genitourinary: Negative.   Musculoskeletal: Negative.   Skin: Negative.   Neurological: Negative.   Endo/Heme/Allergies: Negative.   Psychiatric/Behavioral: Negative.      PHYSICAL EXAMINATION:    There were no vitals taken for this visit.    General appearance: alert, cooperative and appears stated age  Pelvic: External genitalia:  no lesions              Urethra:  normal appearing urethra with no masses, tenderness or lesions              Bartholins and Skenes: normal                 Vagina: normal appearing vagina with normal color and discharge, no lesions              Cervix: no gross lesions  Colposcopy: not satisfactory, mild aceto-white changes posteriorly, biopsy taken at 6 o'clock. Endocervical curettage done. Decreased lugols uptake on the cervix beyond the transformation zone, biopsy taken at 9 o'clock. No abnormalities in the upper vagina.  Chaperone was present for exam.  ASSESSMENT Recurrent ASCUS pap's    PLAN Colposcopy with biopsies and ECC Further plans depending on the results   An After Visit Summary was printed and given to the patient.  CC: Suann Larry, NP

## 2017-08-02 ENCOUNTER — Telehealth: Payer: Self-pay

## 2017-08-02 NOTE — Telephone Encounter (Signed)
Called patient and left message for her to return my call. 

## 2017-08-02 NOTE — Telephone Encounter (Signed)
Patient returned call

## 2017-08-02 NOTE — Telephone Encounter (Signed)
Patient is returning a call to Amanda.

## 2017-08-02 NOTE — Telephone Encounter (Signed)
-----   Message from Romualdo Bolk, MD sent at 08/02/2017  7:46 AM EDT ----- Please let the patient know that her pap returned with HPV effect, but no dysplasia. She should have another pap with reflex hpv testing in one year. Please send a copy to her primary.  CC: Suann Larry, NP

## 2017-08-03 NOTE — Telephone Encounter (Signed)
Spoke with patient, advised as seen below per Dr. Oscar La. Patient request copy of colpo results be mailed directly to her, address on file confirmed.  Patient verbalizes understanding and is agreeable.   Copy of colpo results faxed via Epic to PCP/ Suann Larry, NP  Results printed and placed in outgoing mail.   Encounter closed.
